# Patient Record
Sex: Female | Born: 1975 | Race: Black or African American | Hispanic: No | Marital: Single | Smoking: Current every day smoker
Health system: Southern US, Community
[De-identification: ages and names within clinical notes are randomized; demographics above are authoritative.]

## PROBLEM LIST (undated history)

## (undated) ENCOUNTER — Ambulatory Visit (HOSPITAL_COMMUNITY): Payer: Medicaid Other

## (undated) DIAGNOSIS — D649 Anemia, unspecified: Secondary | ICD-10-CM

## (undated) DIAGNOSIS — J45909 Unspecified asthma, uncomplicated: Secondary | ICD-10-CM

## (undated) DIAGNOSIS — J40 Bronchitis, not specified as acute or chronic: Secondary | ICD-10-CM

## (undated) HISTORY — PX: KNEE SURGERY: SHX244

## (undated) HISTORY — PX: CARPAL TUNNEL RELEASE: SHX101

## (undated) HISTORY — PX: HERNIA REPAIR: SHX51

---

## 2005-02-12 ENCOUNTER — Emergency Department: Payer: Self-pay | Admitting: Emergency Medicine

## 2005-02-27 ENCOUNTER — Emergency Department: Payer: Self-pay | Admitting: Unknown Physician Specialty
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
## 2005-10-29 ENCOUNTER — Observation Stay: Payer: Self-pay

## 2005-11-26 ENCOUNTER — Observation Stay: Payer: Self-pay | Admitting: Obstetrics & Gynecology

## 2005-12-11 ENCOUNTER — Inpatient Hospital Stay: Payer: Self-pay

## 2006-03-07 ENCOUNTER — Ambulatory Visit: Payer: Self-pay | Admitting: Surgery

## 2006-10-01 ENCOUNTER — Ambulatory Visit: Payer: Self-pay | Admitting: General Practice

## 2006-10-01 IMAGING — CR DG KNEE 1-2V*L*
1 series · 2 of 2 positions shown · non-contrast
Comparison: none

REASON FOR EXAM: Legs give out, depression, GIORGI problems
COMMENTS:

PROCEDURE:     DXR - DXR KNEE LEFT AP AND LATERAL  - [DATE]  [DATE]
RESULT:     Views of the LEFT knee demonstrate no definite fracture,
dislocation or radiopaque foreign body.

[Series 1: view not recorded · 0.17mm/px · 2 of 2 slices shown]
[im 1/2]
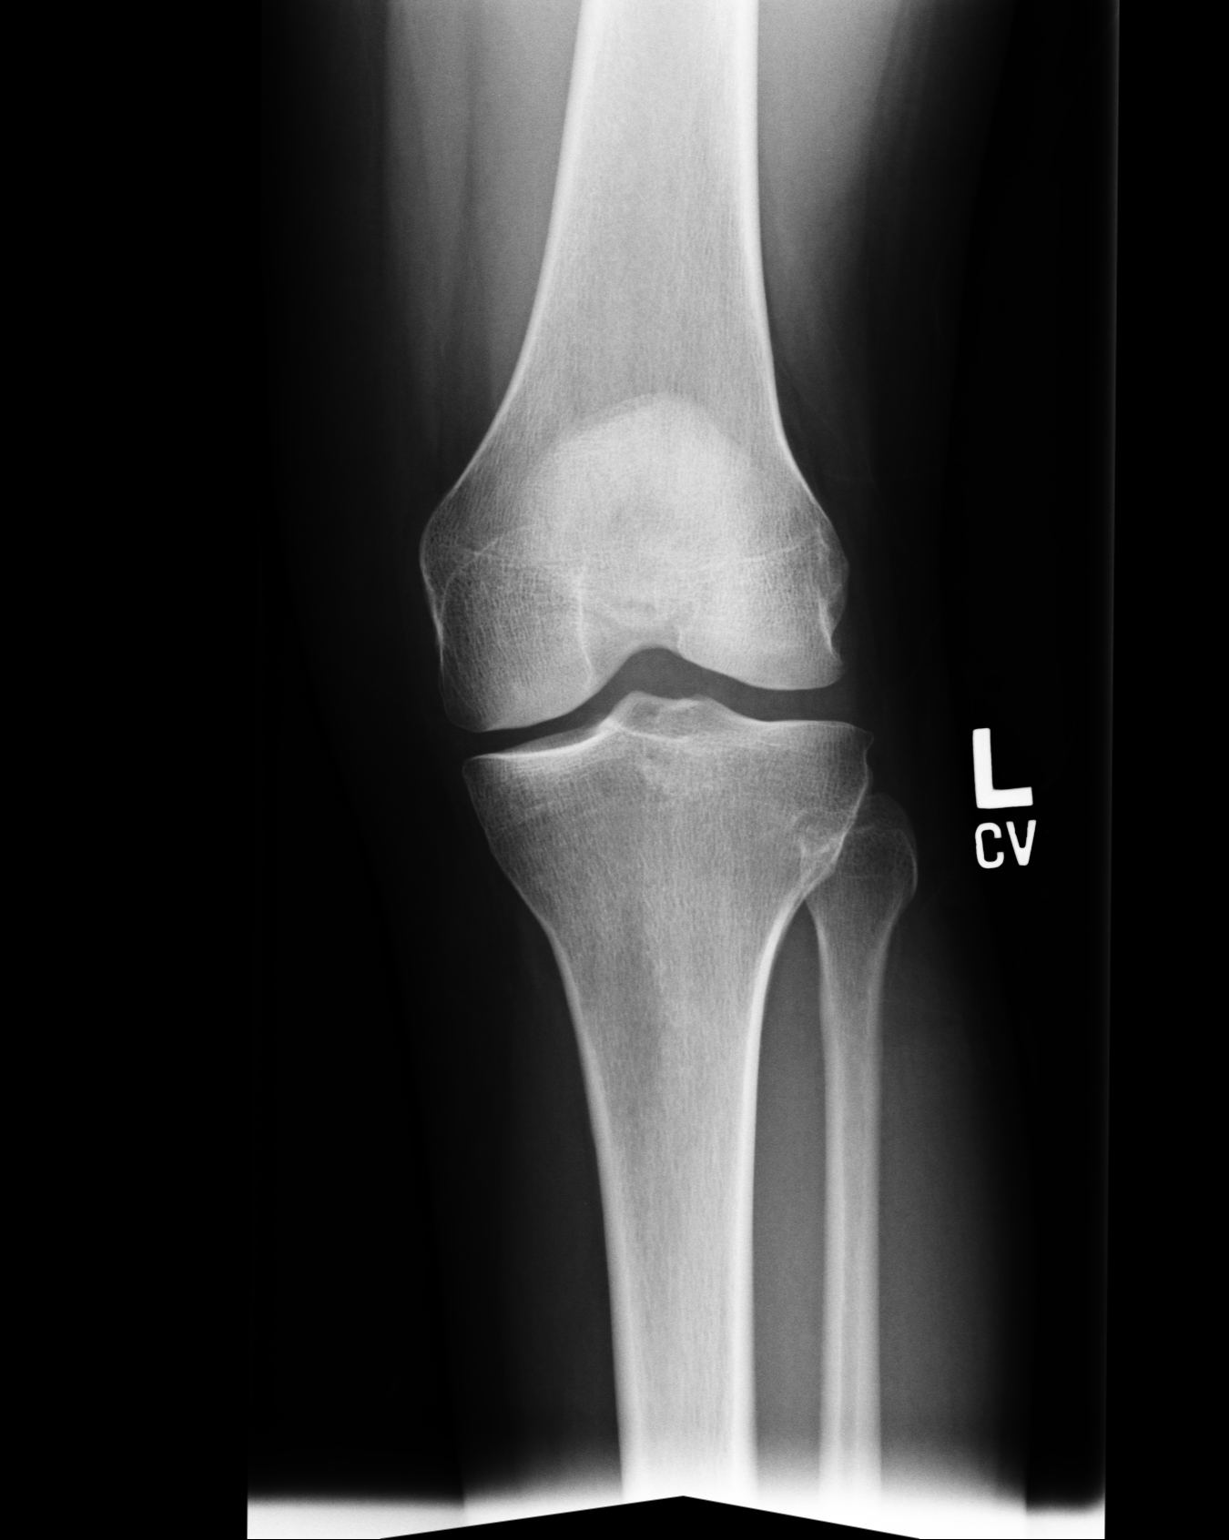
[im 2/2]
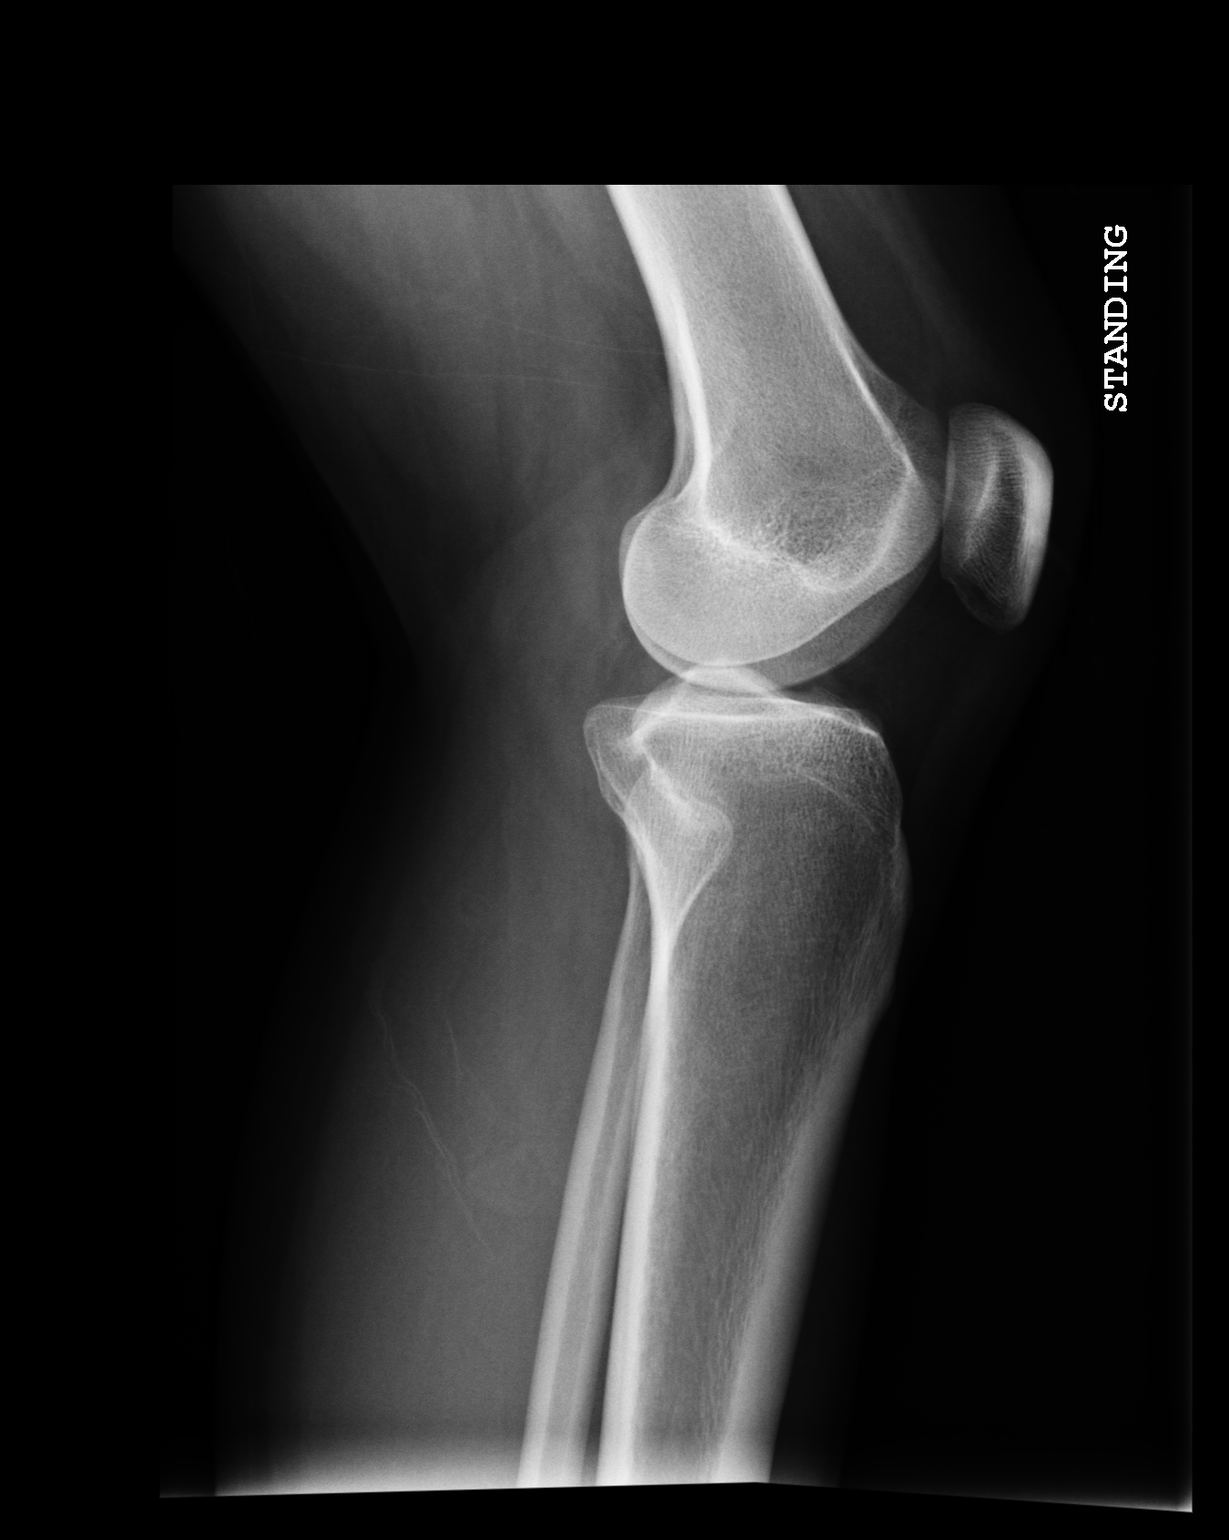

[2 of 2 positions shown; findings below may reference images not displayed]

IMPRESSION: No acute bony abnormality.

## 2008-09-12 ENCOUNTER — Emergency Department: Payer: Self-pay | Admitting: Emergency Medicine

## 2009-12-03 ENCOUNTER — Emergency Department: Payer: Self-pay | Admitting: Emergency Medicine

## 2009-12-03 IMAGING — US TRANSABDOMINAL ULTRASOUND OF PELVIS
1 series · 17 of 25 positions shown · non-contrast
Comparison: none

REASON FOR EXAM: lt sided pelvic pain
COMMENTS:

PROCEDURE:     US  - US PELVIS EXAM  - [DATE]  [DATE]
RESULT:     Pelvic ultrasound dated [DATE].
TECHNIQUE: Transabdominal imaging of the pelvis was obtained.

[Series 1: transabdominal ultrasound of pelvis · 17 of 41 slices shown]
[im 1/41]
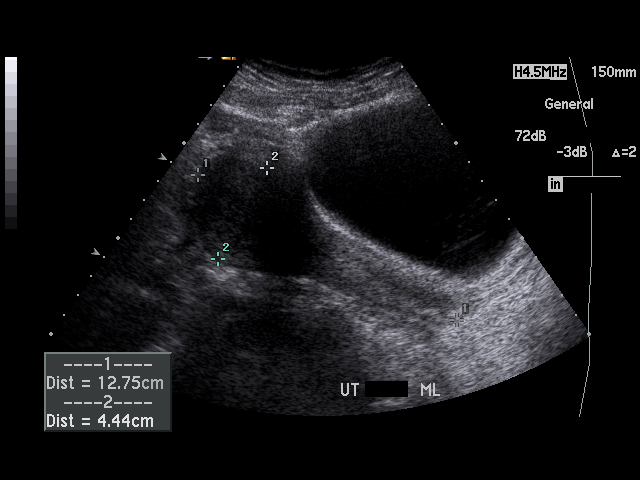
[im 4/41]
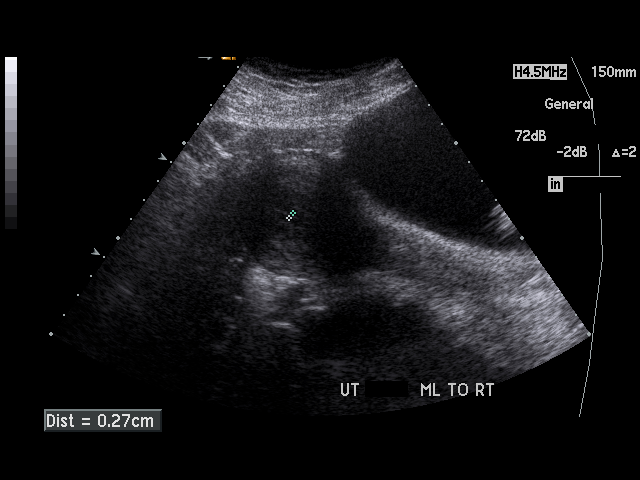
[im 6/41]
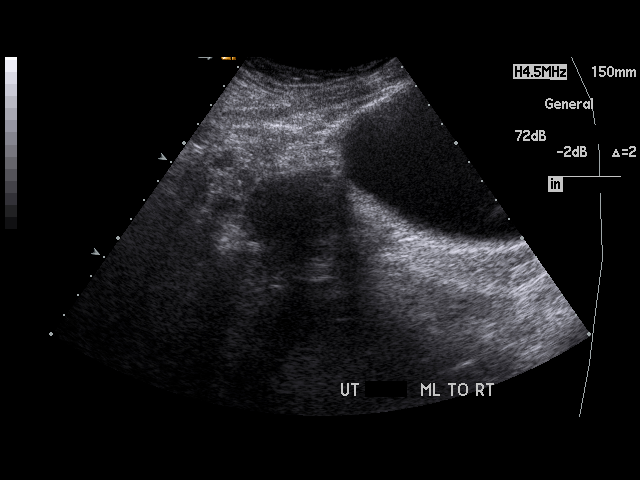
[im 9/41]
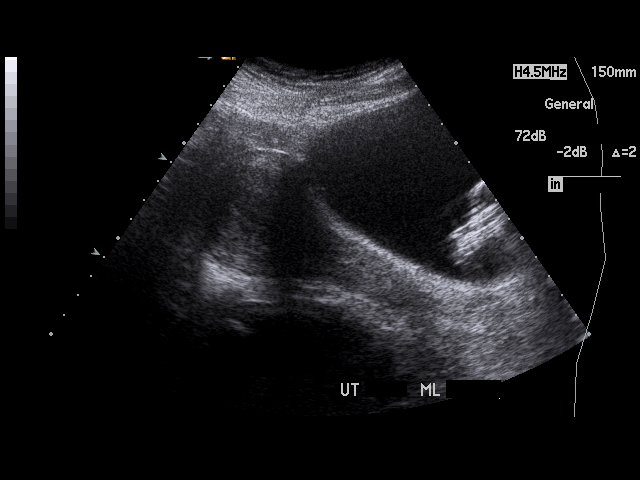
[im 11/41]
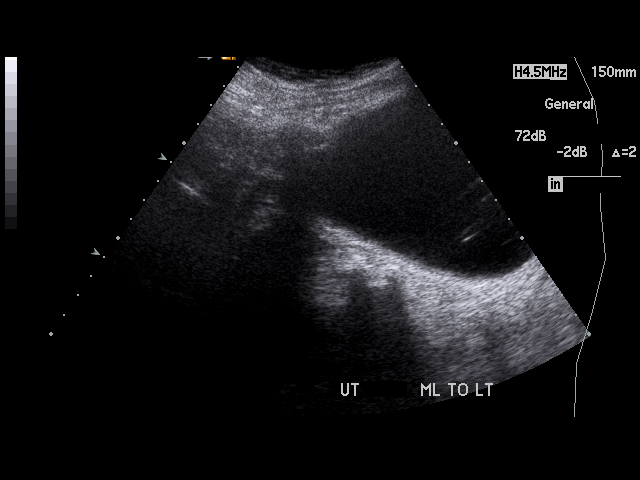
[im 14/41]
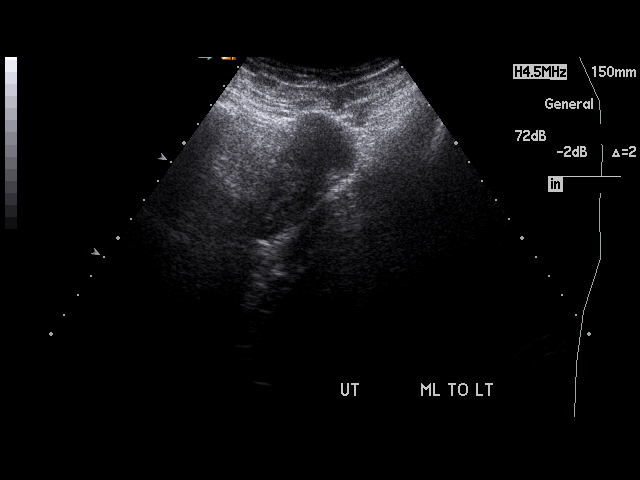
[im 16/41]
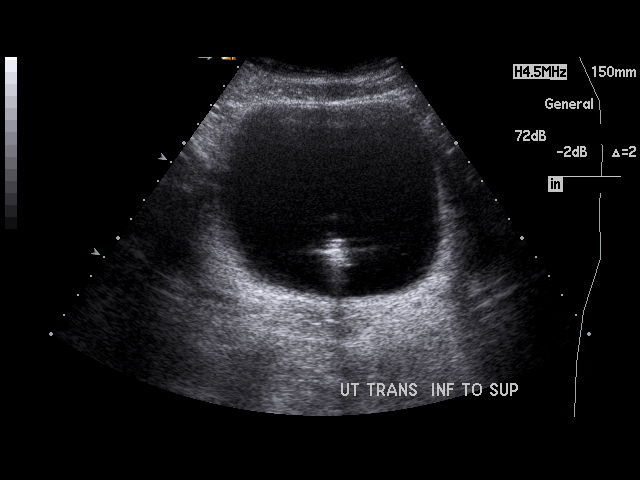
[im 19/41]
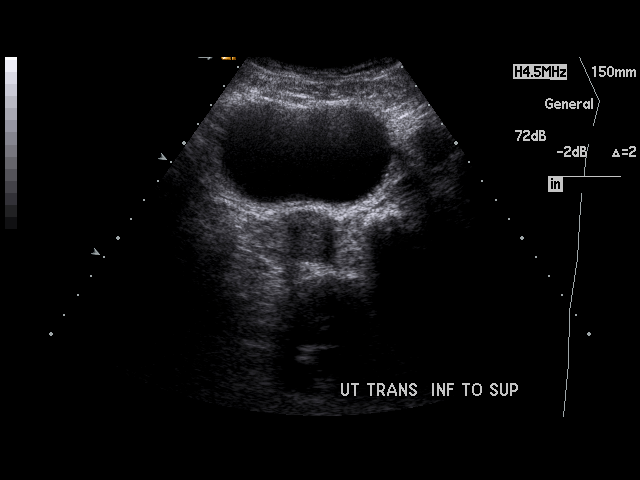
[im 21/41]
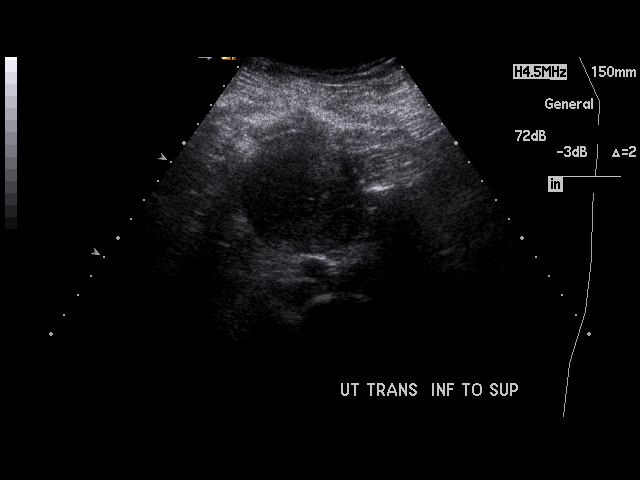
[im 22/41]
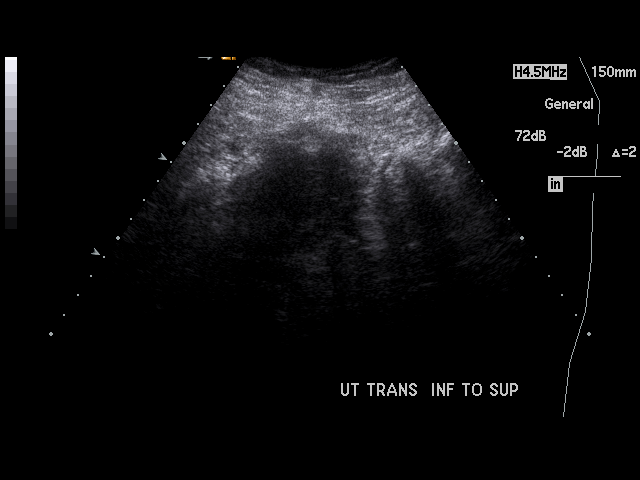
[im 26/41]
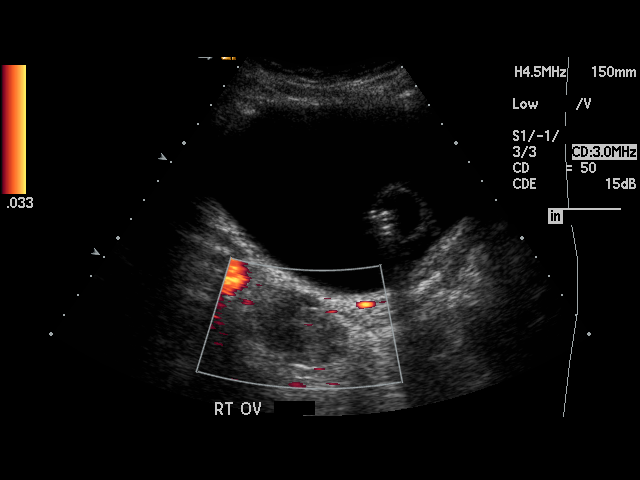
[im 27/41]
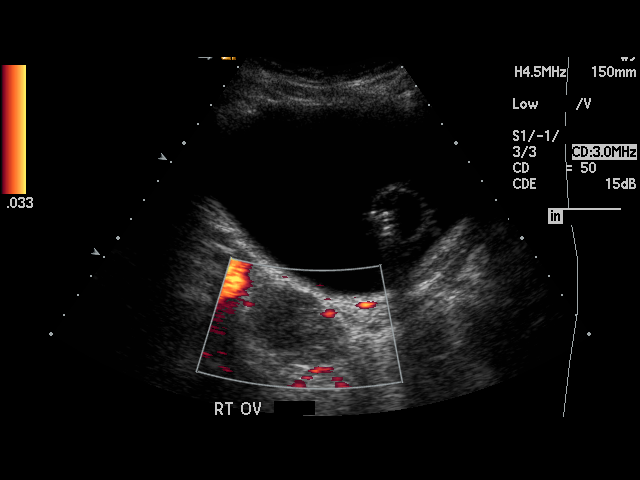
[im 31/41]
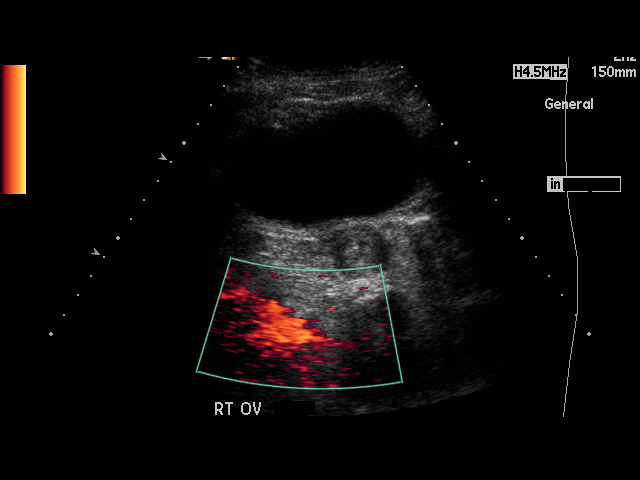
[im 32/41]
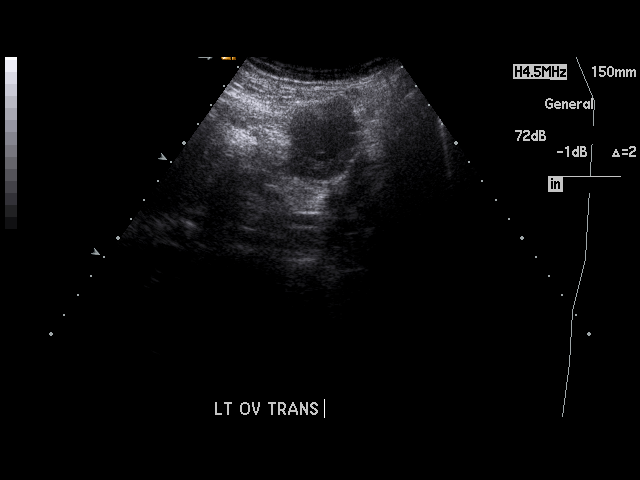
[im 36/41]
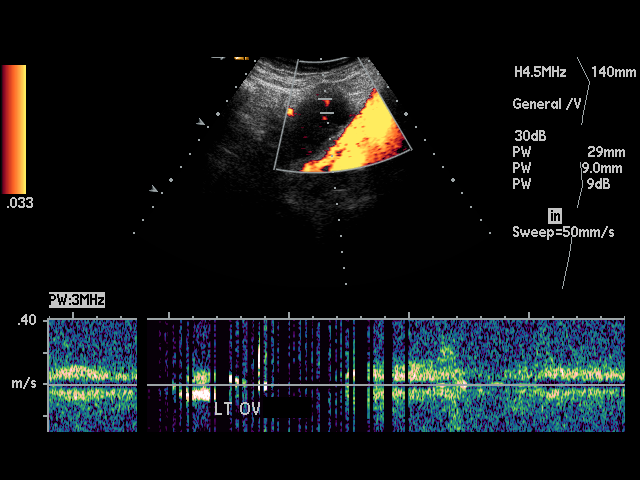
[im 37/41]
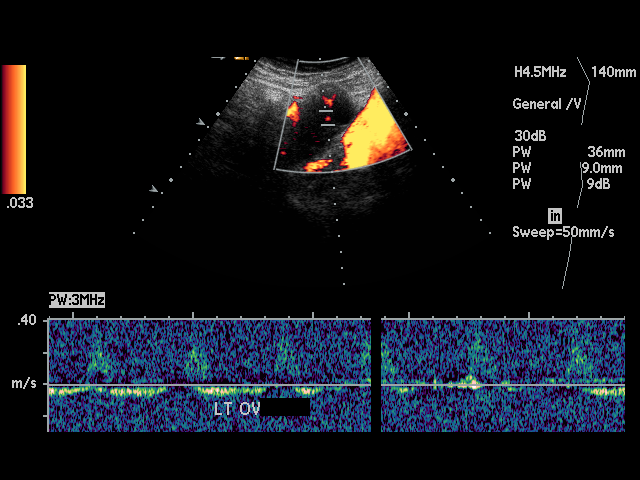
[im 41/41]
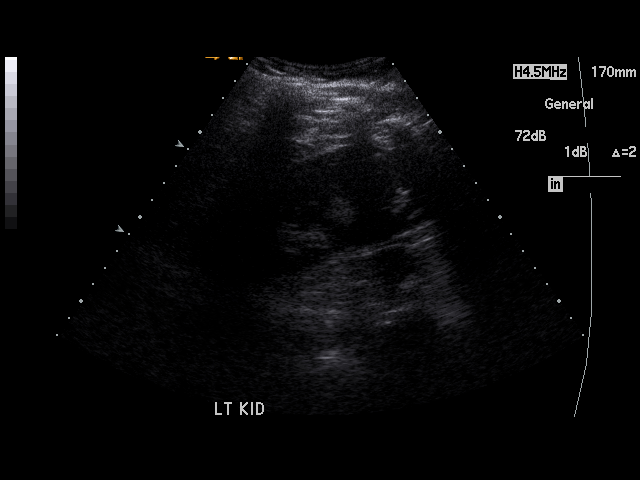

[17 of 25 positions shown; findings below may reference images not displayed]

FINDINGS: The uterus measures 12.75 x 4.4 for a 5.42 cm. The endometrial
thickness is 2.7 millimeters. The uterus is enlarged though demonstrates a
homogeneous echotexture. The right ovary measures 4.3 x 2.98 x 2.98 cm and
the left 5.2 x 2.7 x 3 cm. The ovaries demonstrate a homogeneous echotexture
arterial and venous flow is appreciated within the ovaries. There is no
evidence of pelvic free fluid, loculated fluid collections. There is no
evidence of hydronephrosis.
IMPRESSION: Enlarged uterus otherwise unremarkable pelvic ultrasound.

## 2011-04-16 ENCOUNTER — Ambulatory Visit: Payer: Self-pay | Admitting: Specialist

## 2011-04-27 ENCOUNTER — Ambulatory Visit: Payer: Self-pay | Admitting: Specialist

## 2011-05-02 DIAGNOSIS — M545 Low back pain, unspecified: Secondary | ICD-10-CM | POA: Insufficient documentation

## 2011-05-02 DIAGNOSIS — M5126 Other intervertebral disc displacement, lumbar region: Secondary | ICD-10-CM | POA: Insufficient documentation

## 2011-05-29 ENCOUNTER — Encounter: Payer: Self-pay | Admitting: Orthopedic Surgery

## 2011-05-31 ENCOUNTER — Encounter: Payer: Self-pay | Admitting: Orthopedic Surgery

## 2011-06-30 ENCOUNTER — Encounter: Payer: Self-pay | Admitting: Orthopedic Surgery

## 2011-07-31 ENCOUNTER — Encounter: Payer: Self-pay | Admitting: Orthopedic Surgery

## 2012-03-25 ENCOUNTER — Emergency Department: Payer: Self-pay | Admitting: Emergency Medicine

## 2012-04-24 ENCOUNTER — Encounter: Payer: Self-pay | Admitting: Primary Care

## 2012-04-29 ENCOUNTER — Encounter: Payer: Self-pay | Admitting: Primary Care

## 2012-05-30 ENCOUNTER — Encounter: Payer: Self-pay | Admitting: Primary Care

## 2013-02-13 DIAGNOSIS — N939 Abnormal uterine and vaginal bleeding, unspecified: Secondary | ICD-10-CM | POA: Insufficient documentation

## 2015-05-12 ENCOUNTER — Emergency Department
Admission: EM | Admit: 2015-05-12 | Discharge: 2015-05-12 | Disposition: A | Discharge status: Home / Self Care | Payer: Medicaid Other | Attending: Emergency Medicine | Admitting: Emergency Medicine

## 2015-05-12 ENCOUNTER — Emergency Department: Payer: Medicaid Other

## 2015-05-12 ENCOUNTER — Encounter: Payer: Self-pay | Admitting: Emergency Medicine

## 2015-05-12 DIAGNOSIS — M79642 Pain in left hand: Secondary | ICD-10-CM | POA: Diagnosis not present

## 2015-05-12 DIAGNOSIS — Z720 Tobacco use: Secondary | ICD-10-CM | POA: Insufficient documentation

## 2015-05-12 HISTORY — DX: Unspecified asthma, uncomplicated: J45.909

## 2015-05-12 IMAGING — CR DG HAND COMPLETE 3+V*L*
1 series · 3 of 3 positions shown · non-contrast
Comparison: None.

CLINICAL DATA: 39-year-old female with acute left hand pain for 1
week.

EXAM:
LEFT HAND - COMPLETE 3+ VIEW

[Series 1: x hand pa left · 0.14mm/px · 3 of 3 slices shown]
[im 1/3]
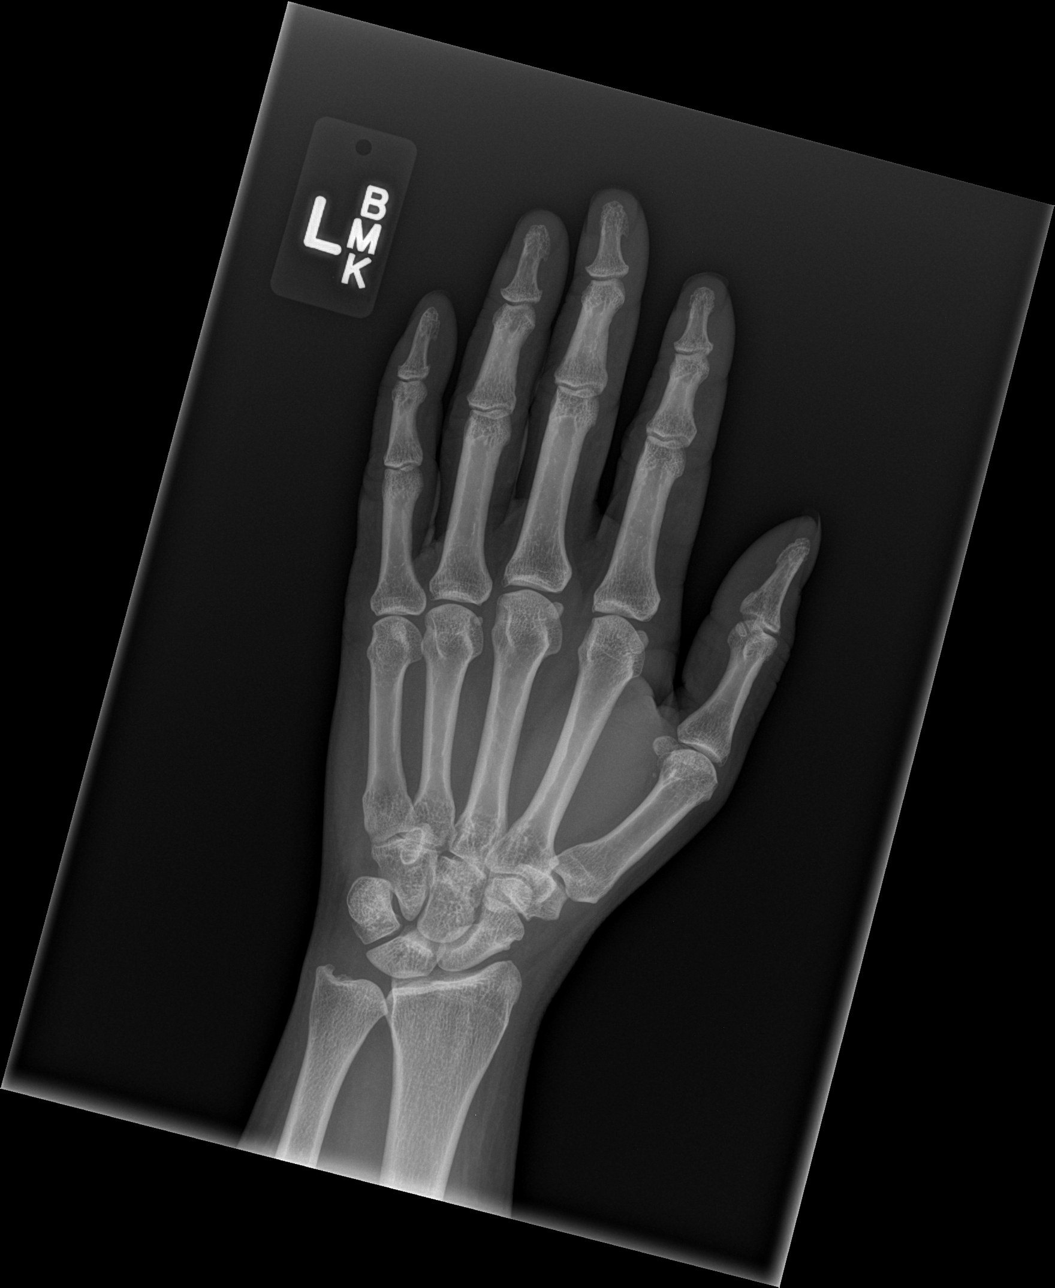
[im 2/3]
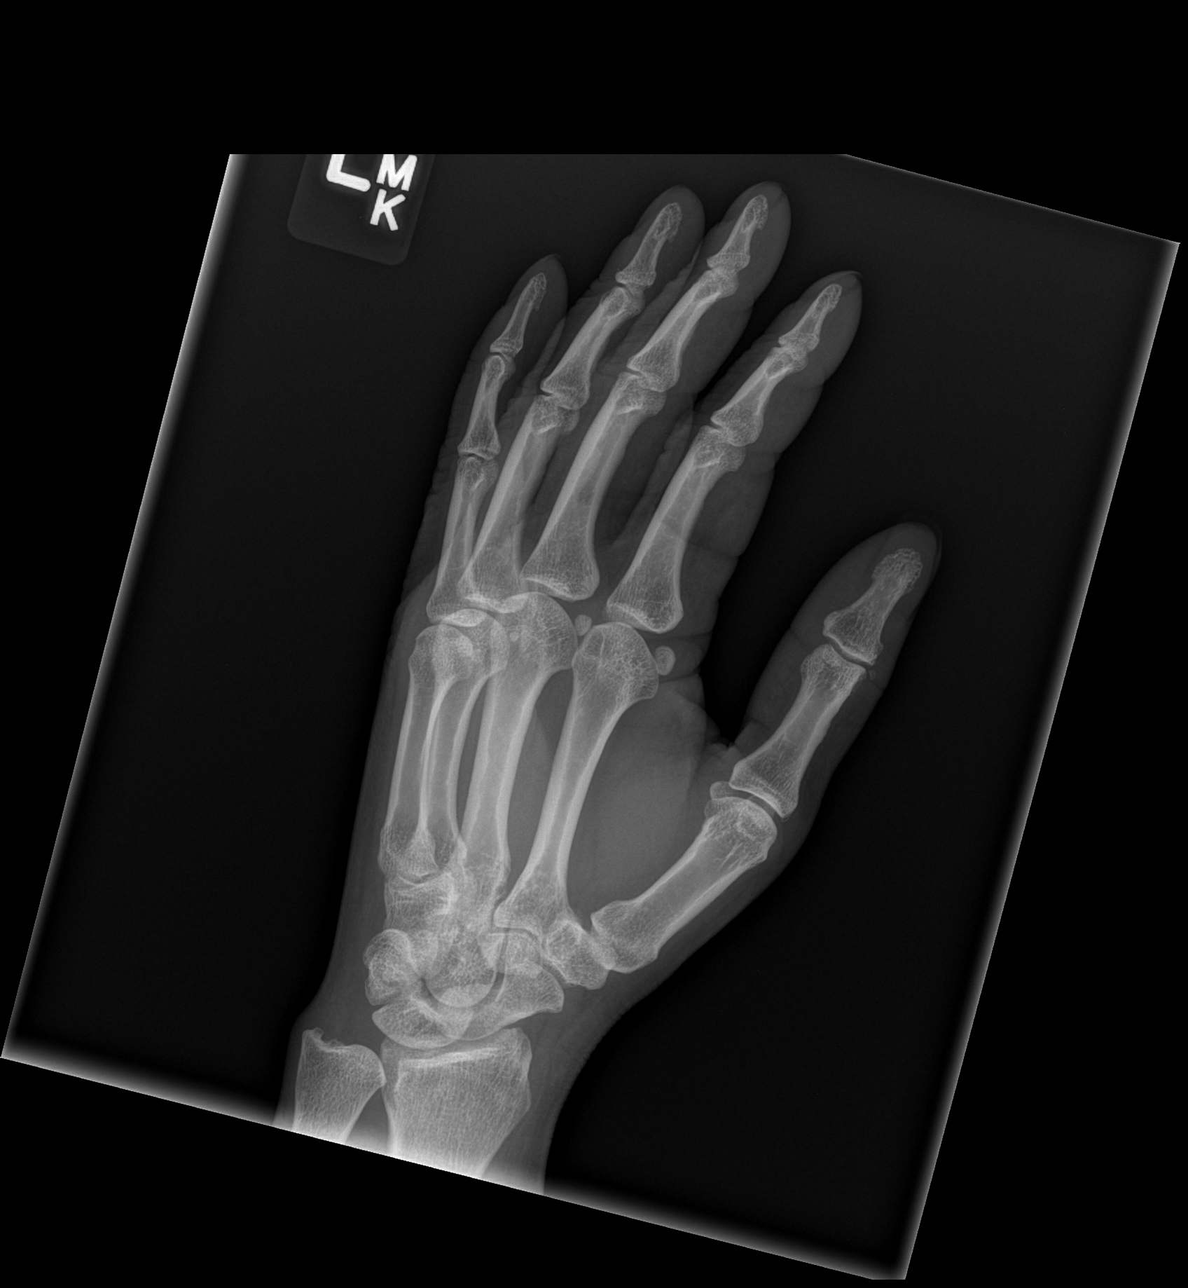
[im 3/3]
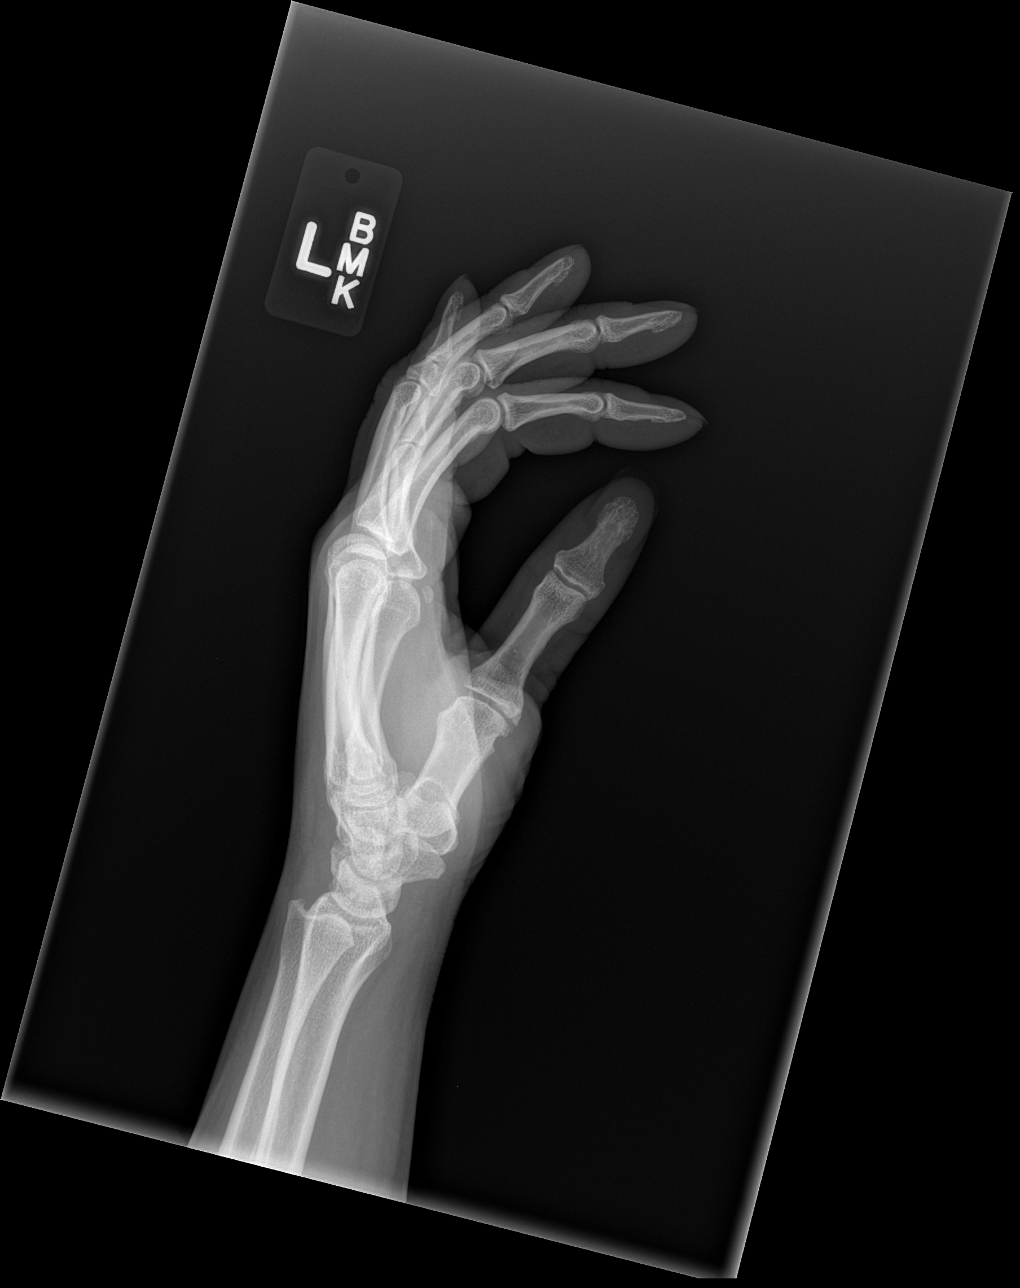

[3 of 3 positions shown; findings below may reference images not displayed]

FINDINGS: There is no evidence of fracture or dislocation. There is no
evidence of arthropathy or other focal bone abnormality. Soft
tissues are unremarkable.
IMPRESSION: Negative.

## 2015-05-12 MED ORDER — NABUMETONE 750 MG PO TABS
750.0000 mg | ORAL_TABLET | Freq: Two times a day (BID) | ORAL | Status: DC
Start: 2015-05-12 — End: 2015-10-05

## 2015-05-12 MED ORDER — CYCLOBENZAPRINE HCL 5 MG PO TABS: 5.0000 mg | ORAL_TABLET | Freq: Three times a day (TID) | ORAL | Status: DC | PRN

## 2015-05-12 NOTE — ED Provider Notes (Signed)
[facility] Emergency Department Provider Note ____________________________________________  Time seen: 76  I have reviewed the triage vital signs and the nursing notes.  HISTORY  Chief Complaint  Hand Pain  HPI Nichole Barber is a 39 y.o. female reports to the ED for evaluation management of pain to the left wrist since yesterday. She denies any significant injury, trauma, crush, fall. She is been using Epsom salts soaks, alcohol or rubs at her wrist cock-up splint without significant relief. She also been dosing ibuprofen intermittently for pain relief. She gives a remote history of a bilateral carpal tunnel release done in about 2013.Since that time she is noted intermittent hand pain. She claims to be ambidextrous in her activities. She rates her pain at a 10/10 in triage.  Past Medical History  Diagnosis Date  . Asthma     There are no active problems to display for this patient.   Past Surgical History  Procedure Laterality Date  . Carpal tunnel release      Current Outpatient Rx  Name  Route  Sig  Dispense  Refill  . cyclobenzaprine (FLEXERIL) 5 MG tablet   Oral   Take 1 tablet (5 mg total) by mouth every 8 (eight) hours as needed for muscle spasms.   12 tablet   0   . nabumetone (RELAFEN) 750 MG tablet   Oral   Take 1 tablet (750 mg total) by mouth 2 (two) times daily.   30 tablet   0     Allergies Review of patient's allergies indicates no known allergies.  No family history on file.  Social History Social History  Substance Use Topics  . Smoking status: Current Every Day Smoker  . Smokeless tobacco: None  . Alcohol Use: No   Review of Systems  Constitutional: Negative for fever. Eyes: Negative for visual changes. ENT: Negative for sore throat. Cardiovascular: Negative for chest pain. Respiratory: Negative for shortness of breath. Gastrointestinal: Negative for abdominal pain, vomiting and diarrhea. Genitourinary:  Negative for dysuria. Musculoskeletal: Negative for back pain. Left hand pain as above Skin: Negative for rash. Neurological: Negative for headaches, focal weakness or numbness. ____________________________________________  PHYSICAL EXAM:  VITAL SIGNS: ED Triage Vitals  Enc Vitals Group     BP 05/12/15 1844 131/83 mmHg     Pulse Rate 05/12/15 1844 105     Resp 05/12/15 1844 18     Temp 05/12/15 1844 98.2 F (36.8 C)     Temp Source 05/12/15 1844 Oral     SpO2 05/12/15 1844 100 %     Weight 05/12/15 1844 150 lb (68.04 kg)     Height 05/12/15 1844 5\' 5"  (1.651 m)     Head Cir --      Peak Flow --      Pain Score 05/12/15 1845 10     Pain Loc --      Pain Edu? --      Excl. in [place]? --    Constitutional: Alert and oriented. Well appearing and in no distress. Head: Normocephalic and atraumatic.      Eyes: Conjunctivae are normal. PERRL. Normal extraocular movements      Ears: Canals clear. TMs intact bilaterally.   Nose: No congestion/rhinorrhea.   Mouth/Throat: Mucous membranes are moist.   Neck: Supple. No thyromegaly. Hematological/Lymphatic/Immunological: No cervical lymphadenopathy. Cardiovascular: Normal rate, regular rhythm.  Respiratory: Normal respiratory effort. No wheezes/rales/rhonchi. Gastrointestinal: Soft and nontender. No distention. Musculoskeletal: Left hand without obvious deformity, dislocation, abrasion, effusion, soft tissue  swelling. Normal composite fist on exam. Self-limited range of motion on exam. Nontender with normal range of motion in all extremities.  Neurologic:  Cranial nerves II through XII grossly intact. Normal upper prescription DTRs bilaterally. Normal intrinsic and opposition testing. Negative Finkelstein. No cubital or carpal Tinel's. Normal gait without ataxia. Normal speech and language. No gross focal neurologic deficits are appreciated. Skin:  Skin is warm, dry and intact. No rash noted. Psychiatric: Mood and affect are normal.  Patient exhibits appropriate insight and judgment. ____________________________________________   RADIOLOGY Left Hand IMPRESSION: Negative.  I, Darl Kuss, Dannielle Karvonen, personally viewed and evaluated these images (plain radiographs) as part of my medical decision making.  ____________________________________________  PROCEDURES  Ace wrap Home cock-up splint ____________________________________________  INITIAL IMPRESSION / ASSESSMENT AND PLAN / ED COURSE  Radiology results to the patient. She discharged home with a left hand pain/sprain. 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 She is provided with prescription for Flexeril and Relafen. She is to follow with Dr. Earnestine Leys for ongoing symptoms. ____________________________________________  FINAL CLINICAL IMPRESSION(S) / ED DIAGNOSES  Final diagnoses:  Hand pain, left      Melvenia Needles, PA-C 05/12/15 2015  Hinda Kehr, MD 05/12/15 2313

## 2015-05-12 NOTE — Discharge Instructions (Signed)
Musculoskeletal Pain Musculoskeletal pain is muscle and boney aches and pains. These pains can occur in any part of the body. Your caregiver may treat you without knowing the cause of the pain. They may treat you if blood or urine tests, X-rays, and other tests were normal.  CAUSES There is often not a definite cause or reason for these pains. These pains may be caused by a type of germ (virus). The discomfort may also come from overuse. Overuse includes working out too hard when your body is not fit. Boney aches also come from weather changes. Bone is sensitive to atmospheric pressure changes. HOME CARE INSTRUCTIONS   Ask when your test results will be ready. Make sure you get your test results.  Only take over-the-counter or prescription medicines for pain, discomfort, or fever as directed by your caregiver. If you were given medications for your condition, do not drive, operate machinery or power tools, or sign legal documents for 24 hours. Do not drink alcohol. Do not take sleeping pills or other medications that may interfere with treatment.  Continue all activities unless the activities cause more pain. When the pain lessens, slowly resume normal activities. Gradually increase the intensity and duration of the activities or exercise.  During periods of severe pain, bed rest may be helpful. Lay or sit in any position that is comfortable.  Putting ice on the injured area.  Put ice in a bag.  Place a towel between your skin and the bag.  Leave the ice on for 15 to 20 minutes, 3 to 4 times a day.  Follow up with your caregiver for continued problems and no reason can be found for the pain. If the pain becomes worse or does not go away, it may be necessary to repeat tests or do additional testing. Your caregiver may need to look further for a possible cause. SEEK IMMEDIATE MEDICAL CARE IF:  You have pain that is getting worse and is not relieved by medications.  You develop chest pain  that is associated with shortness or breath, sweating, feeling sick to your stomach (nauseous), or throw up (vomit).  Your pain becomes localized to the abdomen.  You develop any new symptoms that seem different or that concern you. MAKE SURE YOU:   Understand these instructions.  Will watch your condition.  Will get help right away if you are not doing well or get worse.   This information is not intended to replace advice given to you by your health care provider. Make sure you discuss any questions you have with your health care provider.   Document Released: 07/16/2005 Document Revised: 10/08/2011 Document Reviewed: 03/20/2013 Elsevier Interactive Patient Education [employer].   Your exam and x-ray are normal today.  Continue to wear your wrist splint.  Apply ice to reduce symptoms. Follow-up with Dr. Sabra Heck for continued symptoms.

## 2015-05-12 NOTE — ED Notes (Signed)
PA jenise at bedside.

## 2015-05-12 NOTE — ED Notes (Addendum)
C/o left wrist pain since yesterday, states she has been having pain for a while but worse since yesterday, hx of carpel tunnel

## 2015-06-06 ENCOUNTER — Emergency Department
Admission: EM | Admit: 2015-06-06 | Discharge: 2015-06-06 | Disposition: A | Discharge status: Home / Self Care | Payer: Medicaid Other | Attending: Emergency Medicine | Admitting: Emergency Medicine

## 2015-06-06 ENCOUNTER — Encounter: Payer: Self-pay | Admitting: Emergency Medicine

## 2015-06-06 DIAGNOSIS — S39012A Strain of muscle, fascia and tendon of lower back, initial encounter: Secondary | ICD-10-CM | POA: Diagnosis not present

## 2015-06-06 DIAGNOSIS — Z79899 Other long term (current) drug therapy: Secondary | ICD-10-CM | POA: Diagnosis not present

## 2015-06-06 DIAGNOSIS — Y998 Other external cause status: Secondary | ICD-10-CM | POA: Diagnosis not present

## 2015-06-06 DIAGNOSIS — Z720 Tobacco use: Secondary | ICD-10-CM | POA: Diagnosis not present

## 2015-06-06 DIAGNOSIS — Y9389 Activity, other specified: Secondary | ICD-10-CM | POA: Diagnosis not present

## 2015-06-06 DIAGNOSIS — Y92481 Parking lot as the place of occurrence of the external cause: Secondary | ICD-10-CM | POA: Diagnosis not present

## 2015-06-06 DIAGNOSIS — S3992XA Unspecified injury of lower back, initial encounter: Secondary | ICD-10-CM | POA: Diagnosis present

## 2015-06-06 MED ORDER — CYCLOBENZAPRINE HCL 5 MG PO TABS: 5.0000 mg | ORAL_TABLET | Freq: Three times a day (TID) | ORAL | Status: DC | PRN

## 2015-06-06 MED ORDER — IBUPROFEN 800 MG PO TABS
800.0000 mg | ORAL_TABLET | Freq: Three times a day (TID) | ORAL | Status: DC | PRN
Start: 2015-06-06 — End: 2015-10-05
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
## 2015-06-06 NOTE — Discharge Instructions (Signed)
Cryotherapy Cryotherapy is when you put ice on your injury. Ice helps lessen pain and puffiness (swelling) after an injury. Ice works the best when you start using it in the first 24 to 48 hours after an injury. HOME CARE  Put a dry or damp towel between the ice pack and your skin.  You may press gently on the ice pack.  Leave the ice on for no more than 10 to 20 minutes at a time.  Check your skin after 5 minutes to make sure your skin is okay.  Rest at least 20 minutes between ice pack uses.  Stop using ice when your skin loses feeling (numbness).  Do not use ice on someone who cannot tell you when it hurts. This includes small children and people with memory problems (dementia). GET HELP RIGHT AWAY IF:  You have white spots on your skin.  Your skin turns blue or pale.  Your skin feels waxy or hard.  Your puffiness gets worse. MAKE SURE YOU:   Understand these instructions.  Will watch your condition.  Will get help right away if you are not doing well or get worse.   This information is not intended to replace advice given to you by your health care provider. Make sure you discuss any questions you have with your health care provider.   Document Released: 01/02/2008 Document Revised: 10/08/2011 Document Reviewed: 03/08/2011 Elsevier Interactive Patient Education 2016 [place] Strain With Rehab A strain is an injury in which a tendon or muscle is torn. The muscles and tendons of the lower back are vulnerable to strains. However, these muscles and tendons are very strong and require a great force to be injured. Strains are classified into three categories. Grade 1 strains cause pain, but the tendon is not lengthened. Grade 2 strains include a lengthened ligament, due to the ligament being stretched or partially ruptured. With grade 2 strains there is still function, although the function may be decreased. Grade 3 strains involve a complete tear of the tendon  or muscle, and function is usually impaired. SYMPTOMS   Pain in the lower back.  Pain that affects one side more than the other.  Pain that gets worse with movement and may be felt in the hip, buttocks, or back of the thigh.  Muscle spasms of the muscles in the back.  Swelling along the muscles of the back.  Loss of strength of the back muscles.  Crackling sound (crepitation) when the muscles are touched. CAUSES  Lower back strains occur when a force is placed on the muscles or tendons that is greater than they can handle. Common causes of injury include:  Prolonged overuse of the muscle-tendon units in the lower back, usually from incorrect posture.  A single violent injury or force applied to the back. RISK INCREASES WITH:  Sports that involve twisting forces on the spine or a lot of bending at the waist (football, rugby, weightlifting, bowling, golf, tennis, speed skating, racquetball, swimming, running, gymnastics, diving).  Poor strength and flexibility.  Failure to warm up properly before activity.  Family history of lower back pain or disk disorders.  Previous back injury or surgery (especially fusion).  Poor posture with lifting, especially heavy objects.  Prolonged sitting, especially with poor posture. PREVENTION   Learn and use proper posture when sitting or lifting (maintain proper posture when sitting, lift using the knees and legs, not at the waist).  Warm up and stretch properly before activity.  Allow for adequate recovery between workouts.  Maintain physical fitness:  Strength, flexibility, and endurance.  Cardiovascular fitness. PROGNOSIS  If treated properly, lower back strains usually heal within 6 weeks. RELATED COMPLICATIONS   Recurring symptoms, resulting in a chronic problem.  Chronic inflammation, scarring, and partial muscle-tendon tear.  Delayed healing or resolution of symptoms.  Prolonged disability. TREATMENT  Treatment first  involves the use of ice and medicine, to reduce pain and inflammation. The use of strengthening and stretching exercises may help reduce pain with activity. These exercises may be performed at home or with a therapist. Severe injuries may require referral to a therapist for further evaluation and treatment, such as ultrasound. Your caregiver may advise that you wear a back brace or corset, to help reduce pain and discomfort. Often, prolonged bed rest results in greater harm then benefit. Corticosteroid injections may be recommended. However, these should be reserved for the most serious cases. It is important to avoid using your back when lifting objects. At night, sleep on your back on a firm mattress with a pillow placed under your knees. If non-surgical treatment is unsuccessful, surgery may be needed.  MEDICATION   If pain medicine is needed, nonsteroidal anti-inflammatory medicines (aspirin and ibuprofen), or other minor pain relievers (acetaminophen), are often advised.  Do not take pain medicine for 7 days before surgery.  Prescription pain relievers may be given, if your caregiver thinks they are needed. Use only as directed and only as much as you need.  Ointments applied to the skin may be helpful.  Corticosteroid injections may be given by your caregiver. These injections should be reserved for the most serious cases, because they may only be given a certain number of times. HEAT AND COLD  Cold treatment (icing) should be applied for 10 to 15 minutes every 2 to 3 hours for inflammation and pain, and immediately after activity that aggravates your symptoms. Use ice packs or an ice massage.  Heat treatment may be used before performing stretching and strengthening activities prescribed by your caregiver, physical therapist, or athletic trainer. Use a heat pack or a warm water soak. SEEK MEDICAL CARE IF:   Symptoms get worse or do not improve in 2 to 4 weeks, despite treatment.  You  develop numbness, weakness, or loss of bowel or bladder function.  New, unexplained symptoms develop. (Drugs used in treatment may produce side effects.) EXERCISES  RANGE OF MOTION (ROM) AND STRETCHING EXERCISES - Low Back Strain Most people with lower back pain will find that their symptoms get worse with excessive bending forward (flexion) or arching at the lower back (extension). The exercises which will help resolve your symptoms will focus on the opposite motion.  Your physician, physical therapist or athletic trainer will help you determine which exercises will be most helpful to resolve your lower back pain. Do not complete any exercises without first consulting with your caregiver. Discontinue any exercises which make your symptoms worse until you speak to your caregiver.  If you have pain, numbness or tingling which travels down into your buttocks, leg or foot, the goal of the therapy is for these symptoms to move closer to your back and eventually resolve. Sometimes, these leg symptoms will get better, but your lower back pain may worsen. This is typically an indication of progress in your rehabilitation. Be very alert to any changes in your symptoms and the activities in which you participated in the 24 hours prior to the change. Sharing this information with your  caregiver will allow him/her to most efficiently treat your condition.  These exercises may help you when beginning to rehabilitate your injury. Your symptoms may resolve with or without further involvement from your physician, physical therapist or athletic trainer. While completing these exercises, remember:  Restoring tissue flexibility helps normal motion to return to the joints. This allows healthier, less painful movement and activity.  An effective stretch should be held for at least 30 seconds.  A stretch should never be painful. You should only feel a gentle lengthening or release in the stretched tissue. FLEXION RANGE  OF MOTION AND STRETCHING EXERCISES: STRETCH - Flexion, Single Knee to Chest   Lie on a firm bed or floor with both legs extended in front of you.  Keeping one leg in contact with the floor, bring your opposite knee to your chest. Hold your leg in place by either grabbing behind your thigh or at your knee.  Pull until you feel a gentle stretch in your lower back. Hold __________ seconds.  Slowly release your grasp and repeat the exercise with the opposite side. Repeat __________ times. Complete this exercise __________ times per day.  STRETCH - Flexion, Double Knee to Chest   Lie on a firm bed or floor with both legs extended in front of you.  Keeping one leg in contact with the floor, bring your opposite knee to your chest.  Tense your stomach muscles to support your back and then lift your other knee to your chest. Hold your legs in place by either grabbing behind your thighs or at your knees.  Pull both knees toward your chest until you feel a gentle stretch in your lower back. Hold __________ seconds.  Tense your stomach muscles and slowly return one leg at a time to the floor. Repeat __________ times. Complete this exercise __________ times per day.  STRETCH - Low Trunk Rotation  Lie on a firm bed or floor. Keeping your legs in front of you, bend your knees so they are both pointed toward the ceiling and your feet are flat on the floor.  Extend your arms out to the side. This will stabilize your upper body by keeping your shoulders in contact with the floor.  Gently and slowly drop both knees together to one side until you feel a gentle stretch in your lower back. Hold for __________ seconds.  Tense your stomach muscles to support your lower back as you bring your knees back to the starting position. Repeat the exercise to the other side. Repeat __________ times. Complete this exercise __________ times per day  EXTENSION RANGE OF MOTION AND FLEXIBILITY EXERCISES: STRETCH -  Extension, Prone on Elbows   Lie on your stomach on the floor, a bed will be too soft. Place your palms about shoulder width apart and at the height of your head.  Place your elbows under your shoulders. If this is too painful, stack pillows under your chest.  Allow your body to relax so that your hips drop lower and make contact more completely with the floor.  Hold this position for __________ seconds.  Slowly return to lying flat on the floor. Repeat __________ times. Complete this exercise __________ times per day.  RANGE OF MOTION - Extension, Prone Press Ups  Lie on your stomach on the floor, a bed will be too soft. Place your palms about shoulder width apart and at the height of your head.  Keeping your back as relaxed as possible, slowly straighten your elbows while keeping  your hips on the floor. You may adjust the placement of your hands to maximize your comfort. As you gain motion, your hands will come more underneath your shoulders.  Hold this position __________ seconds.  Slowly return to lying flat on the floor. Repeat __________ times. Complete this exercise __________ times per day.  RANGE OF MOTION- Quadruped, Neutral Spine   Assume a hands and knees position on a firm surface. Keep your hands under your shoulders and your knees under your hips. You may place padding under your knees for comfort.  Drop your head and point your tail bone toward the ground below you. This will round out your lower back like an angry cat. Hold this position for __________ seconds.  Slowly lift your head and release your tail bone so that your back sags into a large arch, like an old horse.  Hold this position for __________ seconds.  Repeat this until you feel limber in your lower back.  Now, find your "sweet spot." This will be the most comfortable position somewhere between the two previous positions. This is your neutral spine. Once you have found this position, tense your stomach  muscles to support your lower back.  Hold this position for __________ seconds. Repeat __________ times. Complete this exercise __________ times per day.  STRENGTHENING EXERCISES - Low Back Strain These exercises may help you when beginning to rehabilitate your injury. These exercises should be done near your "sweet spot." This is the neutral, low-back arch, somewhere between fully rounded and fully arched, that is your least painful position. When performed in this safe range of motion, these exercises can be used for people who have either a flexion or extension based injury. These exercises may resolve your symptoms with or without further involvement from your physician, physical therapist or athletic trainer. While completing these exercises, remember:   Muscles can gain both the endurance and the strength needed for everyday activities through controlled exercises.  Complete these exercises as instructed by your physician, physical therapist or athletic trainer. Increase the resistance and repetitions only as guided.  You may experience muscle soreness or fatigue, but the pain or discomfort you are trying to eliminate should never worsen during these exercises. If this pain does worsen, stop and make certain you are following the directions exactly. If the pain is still present after adjustments, discontinue the exercise until you can discuss the trouble with your caregiver. STRENGTHENING - Deep Abdominals, Pelvic Tilt  Lie on a firm bed or floor. Keeping your legs in front of you, bend your knees so they are both pointed toward the ceiling and your feet are flat on the floor.  Tense your lower abdominal muscles to press your lower back into the floor. This motion will rotate your pelvis so that your tail bone is scooping upwards rather than pointing at your feet or into the floor.  With a gentle tension and even breathing, hold this position for __________ seconds. Repeat __________ times.  Complete this exercise __________ times per day.  STRENGTHENING - Abdominals, Crunches   Lie on a firm bed or floor. Keeping your legs in front of you, bend your knees so they are both pointed toward the ceiling and your feet are flat on the floor. Cross your arms over your chest.  Slightly tip your chin down without bending your neck.  Tense your abdominals and slowly lift your trunk high enough to just clear your shoulder blades. Lifting higher can put excessive stress on the  lower back and does not further strengthen your abdominal muscles.  Control your return to the starting position. Repeat __________ times. Complete this exercise __________ times per day.  STRENGTHENING - Quadruped, Opposite UE/LE Lift   Assume a hands and knees position on a firm surface. Keep your hands under your shoulders and your knees under your hips. You may place padding under your knees for comfort.  Find your neutral spine and gently tense your abdominal muscles so that you can maintain this position. Your shoulders and hips should form a rectangle that is parallel with the floor and is not twisted.  Keeping your trunk steady, lift your right hand no higher than your shoulder and then your left leg no higher than your hip. Make sure you are not holding your breath. Hold this position __________ seconds.  Continuing to keep your abdominal muscles tense and your back steady, slowly return to your starting position. Repeat with the opposite arm and leg. Repeat __________ times. Complete this exercise __________ times per day.  STRENGTHENING - Lower Abdominals, Double Knee Lift  Lie on a firm bed or floor. Keeping your legs in front of you, bend your knees so they are both pointed toward the ceiling and your feet are flat on the floor.  Tense your abdominal muscles to brace your lower back and slowly lift both of your knees until they come over your hips. Be certain not to hold your breath.  Hold __________  seconds. Using your abdominal muscles, return to the starting position in a slow and controlled manner. Repeat __________ times. Complete this exercise __________ times per day.  POSTURE AND BODY MECHANICS CONSIDERATIONS - Low Back Strain Keeping correct posture when sitting, standing or completing your activities will reduce the stress put on different body tissues, allowing injured tissues a chance to heal and limiting painful experiences. The following are general guidelines for improved posture. Your physician or physical therapist will provide you with any instructions specific to your needs. While reading these guidelines, remember:  The exercises prescribed by your provider will help you have the flexibility and strength to maintain correct postures.  The correct posture provides the best environment for your joints to work. All of your joints have less wear and tear when properly supported by a spine with good posture. This means you will experience a healthier, less painful body.  Correct posture must be practiced with all of your activities, especially prolonged sitting and standing. Correct posture is as important when doing repetitive low-stress activities (typing) as it is when doing a single heavy-load activity (lifting). RESTING POSITIONS Consider which positions are most painful for you when choosing a resting position. If you have pain with flexion-based activities (sitting, bending, stooping, squatting), choose a position that allows you to rest in a less flexed posture. You would want to avoid curling into a fetal position on your side. If your pain worsens with extension-based activities (prolonged standing, working overhead), avoid resting in an extended position such as sleeping on your stomach. Most people will find more comfort when they rest with their spine in a more neutral position, neither too rounded nor too arched. Lying on a non-sagging bed on your side with a pillow  between your knees, or on your back with a pillow under your knees will often provide some relief. Keep in mind, being in any one position for a prolonged period of time, no matter how correct your posture, can still lead to stiffness. PROPER SITTING POSTURE In  order to minimize stress and discomfort on your spine, you must sit with correct posture. Sitting with good posture should be effortless for a healthy body. Returning to good posture is a gradual process. Many people can work toward this most comfortably by using various supports until they have the flexibility and strength to maintain this posture on their own. When sitting with proper posture, your ears will fall over your shoulders and your shoulders will fall over your hips. You should use the back of the chair to support your upper back. Your lower back will be in a neutral position, just slightly arched. You may place a small pillow or folded towel at the base of your lower back for support.  When working at a desk, create an environment that supports good, upright posture. Without extra support, muscles tire, which leads to excessive strain on joints and other tissues. Keep these recommendations in mind: CHAIR:  A chair should be able to slide under your desk when your back makes contact with the back of the chair. This allows you to work closely.  The chair's height should allow your eyes to be level with the upper part of your monitor and your hands to be slightly lower than your elbows. BODY POSITION  Your feet should make contact with the floor. If this is not possible, use a foot rest.  Keep your ears over your shoulders. This will reduce stress on your neck and lower back. INCORRECT SITTING POSTURES  If you are feeling tired and unable to assume a healthy sitting posture, do not slouch or slump. This puts excessive strain on your back tissues, causing more damage and pain. Healthier options include:  Using more support, like a  lumbar pillow.  Switching tasks to something that requires you to be upright or walking.  Talking a brief walk.  Lying down to rest in a neutral-spine position. PROLONGED STANDING WHILE SLIGHTLY LEANING FORWARD  When completing a task that requires you to lean forward while standing in one place for a long time, place either foot up on a stationary 2-4 inch high object to help maintain the best posture. When both feet are on the ground, the lower back tends to lose its slight inward curve. If this curve flattens (or becomes too large), then the back and your other joints will experience too much stress, tire more quickly, and can cause pain. CORRECT STANDING POSTURES Proper standing posture should be assumed with all daily activities, even if they only take a few moments, like when brushing your teeth. As in sitting, your ears should fall over your shoulders and your shoulders should fall over your hips. You should keep a slight tension in your abdominal muscles to brace your spine. Your tailbone should point down to the ground, not behind your body, resulting in an over-extended swayback posture.  INCORRECT STANDING POSTURES  Common incorrect standing postures include a forward head, locked knees and/or an excessive swayback. WALKING Walk with an upright posture. Your ears, shoulders and hips should all line-up. PROLONGED ACTIVITY IN A FLEXED POSITION When completing a task that requires you to bend forward at your waist or lean over a low surface, try to find a way to stabilize 3 out of 4 of your limbs. You can place a hand or elbow on your thigh or rest a knee on the surface you are reaching across. This will provide you more stability so that your muscles do not fatigue as quickly. By keeping your knees  relaxed, or slightly bent, you will also reduce stress across your lower back. CORRECT LIFTING TECHNIQUES DO :   Assume a wide stance. This will provide you more stability and the opportunity  to get as close as possible to the object which you are lifting.  Tense your abdominals to brace your spine. Bend at the knees and hips. Keeping your back locked in a neutral-spine position, lift using your leg muscles. Lift with your legs, keeping your back straight.  Test the weight of unknown objects before attempting to lift them.  Try to keep your elbows locked down at your sides in order get the best strength from your shoulders when carrying an object.  Always ask for help when lifting heavy or awkward objects. INCORRECT LIFTING TECHNIQUES DO NOT:   Lock your knees when lifting, even if it is a small object.  Bend and twist. Pivot at your feet or move your feet when needing to change directions.  Assume that you can safely pick up even a paper clip without proper posture.   This information is not intended to replace advice given to you by your health care provider. Make sure you discuss any questions you have with your health care provider.   Document Released: 07/16/2005 Document Revised: 08/06/2014 Document Reviewed: 10/28/2008 Else[employer] Interactive Patient Education [employer].
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
## 2015-07-15 DIAGNOSIS — Z0000 Encounter for general adult medical examination without abnormal findings: Secondary | ICD-10-CM | POA: Insufficient documentation

## 2015-07-18 ENCOUNTER — Ambulatory Visit: Payer: Medicaid Other | Attending: Primary Care | Admitting: Physical Therapy

## 2015-10-05 ENCOUNTER — Emergency Department
Admission: EM | Admit: 2015-10-05 | Discharge: 2015-10-05 | Disposition: A | Discharge status: Home / Self Care | Payer: Self-pay | Attending: Emergency Medicine | Admitting: Emergency Medicine

## 2015-10-05 ENCOUNTER — Emergency Department: Payer: Self-pay

## 2015-10-05 ENCOUNTER — Encounter: Payer: Self-pay | Admitting: Emergency Medicine

## 2015-10-05 DIAGNOSIS — Y939 Activity, unspecified: Secondary | ICD-10-CM | POA: Insufficient documentation

## 2015-10-05 DIAGNOSIS — F17200 Nicotine dependence, unspecified, uncomplicated: Secondary | ICD-10-CM | POA: Insufficient documentation

## 2015-10-05 DIAGNOSIS — J45909 Unspecified asthma, uncomplicated: Secondary | ICD-10-CM | POA: Insufficient documentation

## 2015-10-05 DIAGNOSIS — S9032XA Contusion of left foot, initial encounter: Secondary | ICD-10-CM | POA: Insufficient documentation

## 2015-10-05 DIAGNOSIS — Y929 Unspecified place or not applicable: Secondary | ICD-10-CM | POA: Insufficient documentation

## 2015-10-05 DIAGNOSIS — Y999 Unspecified external cause status: Secondary | ICD-10-CM | POA: Insufficient documentation

## 2015-10-05 DIAGNOSIS — W228XXA Striking against or struck by other objects, initial encounter: Secondary | ICD-10-CM | POA: Insufficient documentation

## 2015-10-05 IMAGING — DX DG FOOT COMPLETE 3+V*L*
3 series · 3 of 3 positions shown · non-contrast
Comparison: None.

CLINICAL DATA: Left foot pain beginning this morning. The patient
reports diving her left foot on a brick. Initial encounter.

EXAM:
LEFT FOOT - COMPLETE 3+ VIEW

[foot ap]
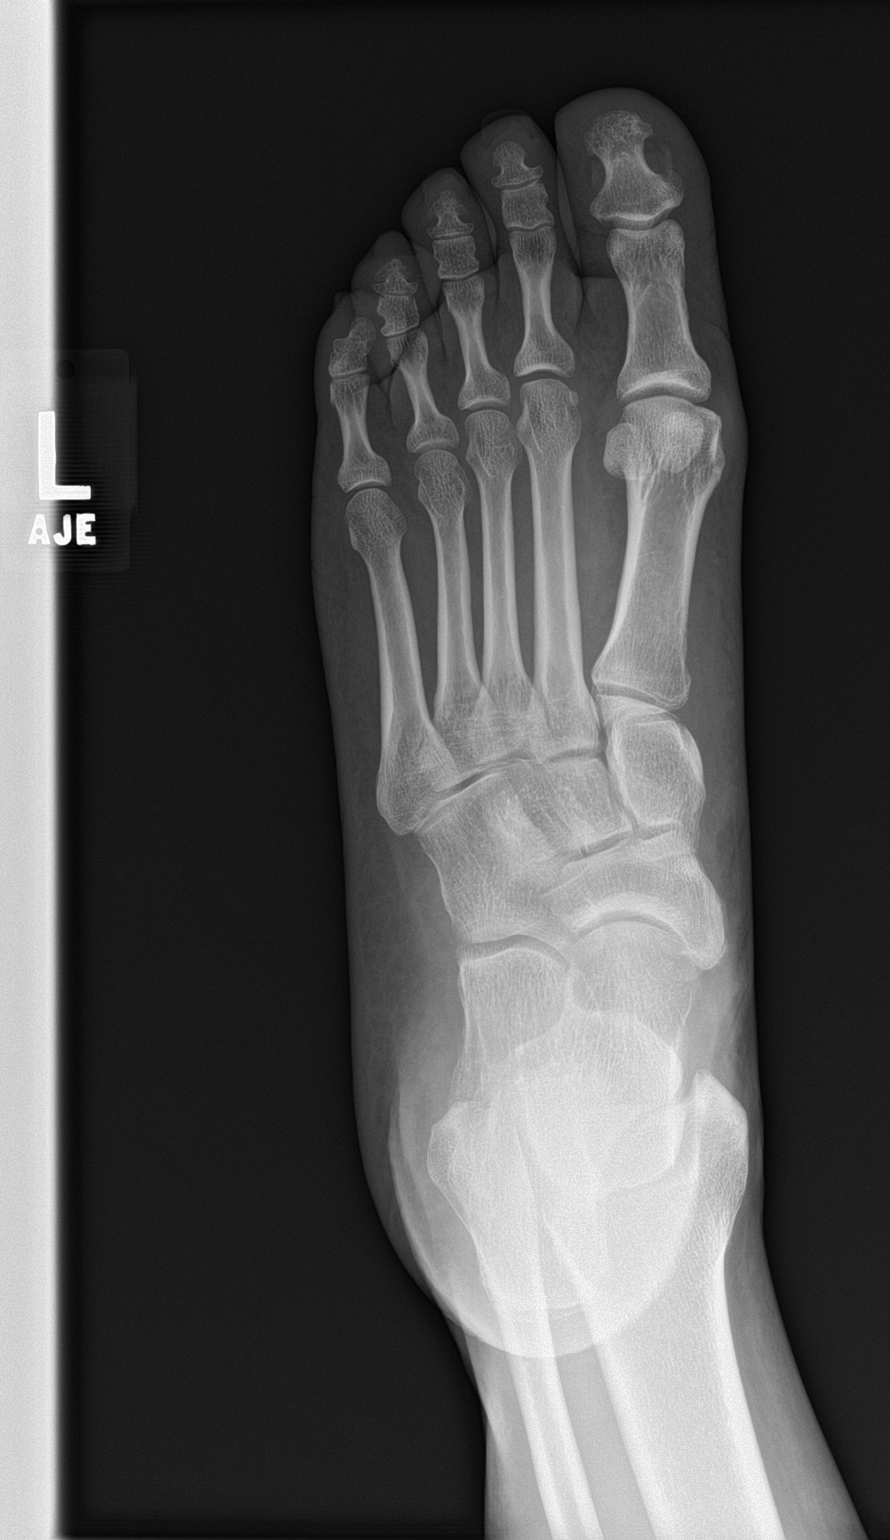

[foot obl]
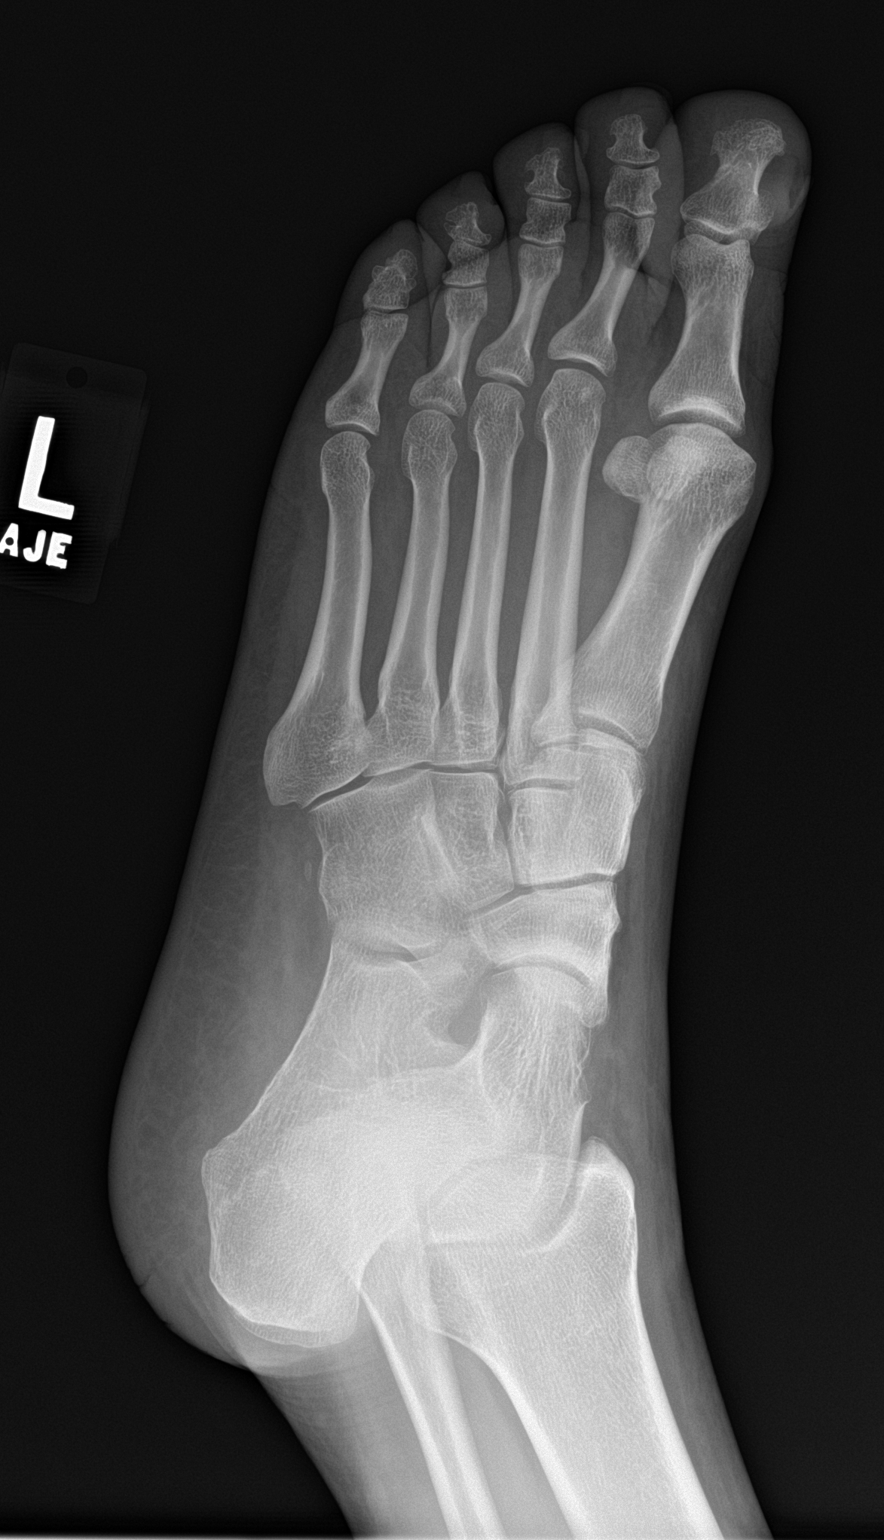

[foot lat]
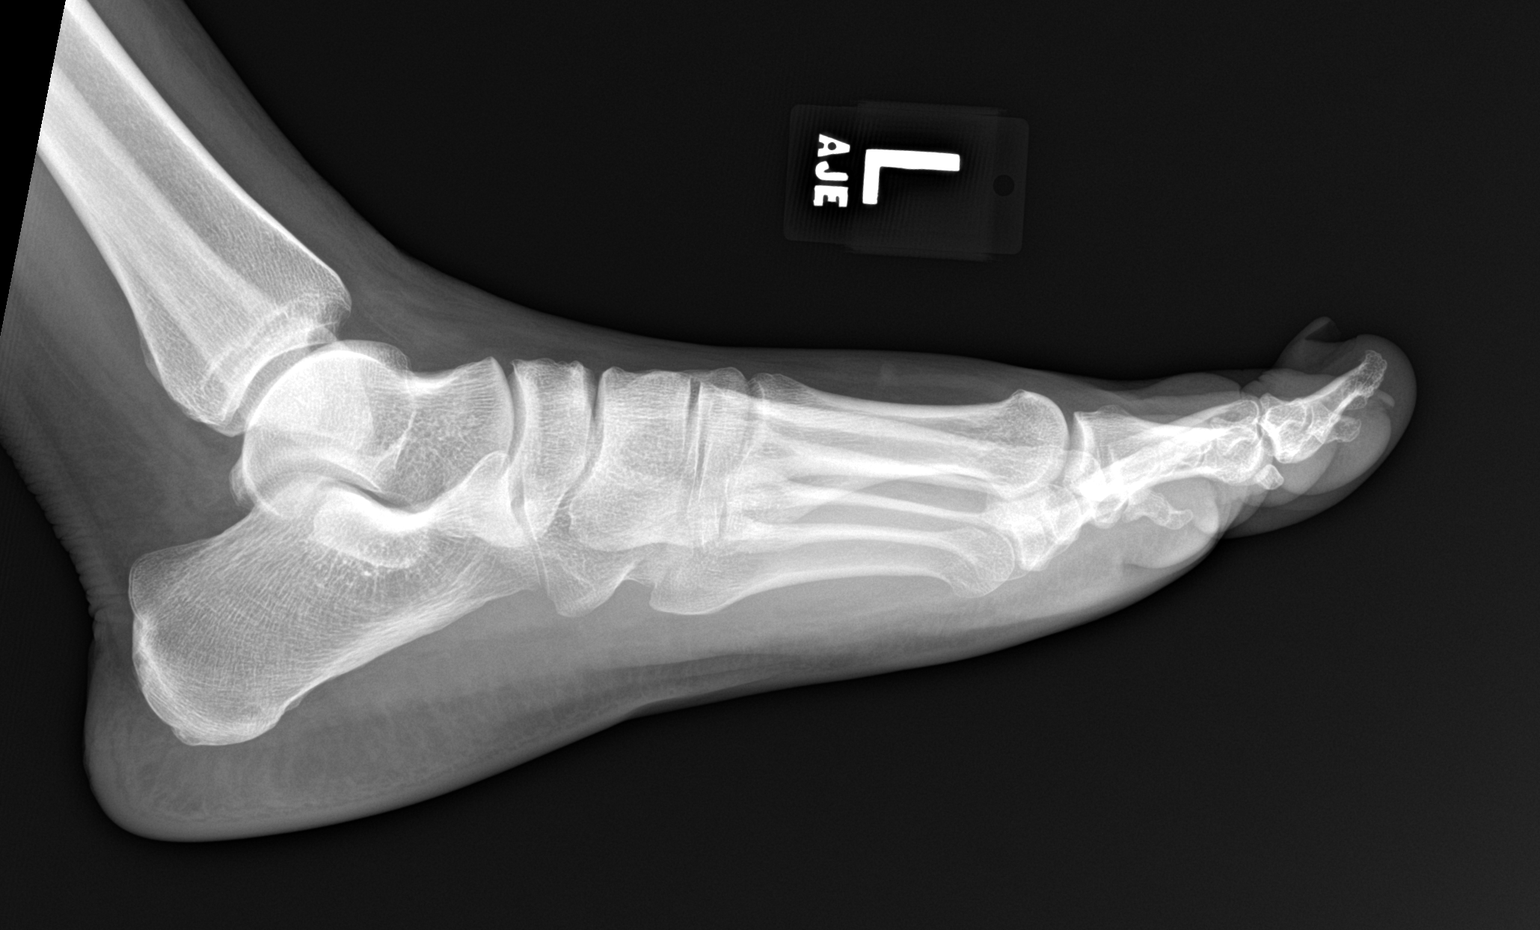

[3 of 3 positions shown; findings below may reference images not displayed]

FINDINGS: There is no evidence of fracture or dislocation. There is no
evidence of arthropathy or other focal bone abnormality. Soft
tissues are unremarkable.
IMPRESSION: Negative exam.

## 2015-10-05 MED ORDER — HYDROCODONE-ACETAMINOPHEN 5-325 MG PO TABS
2.0000 | ORAL_TABLET | Freq: Once | ORAL | Status: AC
  Administered 2015-10-05: 2 via ORAL
  Filled 2015-10-05: qty 2

## 2015-10-05 MED ORDER — IBUPROFEN 800 MG PO TABS: 800.0000 mg | ORAL_TABLET | Freq: Three times a day (TID) | ORAL | Status: DC | PRN

## 2015-10-05 NOTE — ED Provider Notes (Signed)
[facility] Emergency Department Provider Note  ____________________________________________  Time seen: Approximately 6:51 PM  I have reviewed the triage vital signs and the nursing notes.   HISTORY  Chief Complaint Foot Pain    HPI Nichole Barber is a 40 y.o. female presents for evaluation of left first second and third toe pain. Patient states that she stubbed her toe on a brick step last night and this morning she woke up the pain was severe. Abdomen difficulty time putting any weight on her left foot. States that her pain is a 8/10 at this point has not taken any medication for pain at this time. Pain is nonradiating worse with weightbearing and better with elevation.   Past Medical History  Diagnosis Date  . Asthma     There are no active problems to display for this patient.   Past Surgical History  Procedure Laterality Date  . Carpal tunnel release      Current Outpatient Rx  Name  Route  Sig  Dispense  Refill  . ibuprofen (ADVIL,MOTRIN) 800 MG tablet   Oral   Take 1 tablet (800 mg total) by mouth every 8 (eight) hours as needed.   30 tablet   0     Allergies Review of patient's allergies indicates no known allergies.  No family history on file.  Social History Social History  Substance Use Topics  . Smoking status: Current Every Day Smoker  . Smokeless tobacco: None  . Alcohol Use: No    Review of Systems  Musculoskeletal: Left foot first through third toe pain Skin: Negative for rash. No ecchymosis or bruising noted Neurological: Negative for headaches, focal weakness or numbness. Distally neurovascularly intact to the left foot  10-point ROS otherwise negative.  ____________________________________________   PHYSICAL EXAM:  VITAL SIGNS: ED Triage Vitals  Enc Vitals Group     BP 10/05/15 1843 126/75 mmHg     Pulse Rate 10/05/15 1843 99     Resp 10/05/15 1843 18     Temp 10/05/15 1843 98.8 F (37.1 C)   Temp Source 10/05/15 1843 Oral     SpO2 10/05/15 1843 100 %     Weight 10/05/15 1843 147 lb (66.679 kg)     Height 10/05/15 1843 5\' 5"  (1.651 m)     Head Cir --      Peak Flow --      Pain Score 10/05/15 1833 8     Pain Loc --      Pain Edu? --      Excl. in [place]? --     Constitutional: Alert and oriented. Well appearing and in no acute distress. Musculoskeletal: Point tenderness noted to the first second and third toes of the left foot Neurologic:  Normal speech and language. No gross focal neurologic deficits are appreciated. Positive gait instability secondary to pain and left toes. Skin:  Skin is warm, dry and intact. No rash noted. No ecchymosis or bruising left foot.  Psychiatric: Mood and affect are normal. Speech and behavior are normal.  ____________________________________________   LABS (all labs ordered are listed, but only abnormal results are displayed)  Labs Reviewed - No data to display   RADIOLOGY  No acute osseous findings no fractures or dislocations noted. ____________________________________________   PROCEDURES  Procedure(s) performed: None  Critical Care performed: No  ____________________________________________   INITIAL IMPRESSION / ASSESSMENT AND PLAN / ED COURSE  Pertinent labs & imaging results that were available during my care of the patient  were reviewed by me and considered in my medical decision making (see chart for details).  Acute left foot contusion reassurance provided to the patient. Rx given for Motrin 800 mg 3 times a day work excuse 24 hours given. Patient follow-up PCP or return here with any worsening symptomology. ____________________________________________   FINAL CLINICAL IMPRESSION(S) / ED DIAGNOSES  Final diagnoses:  Foot contusion, left, initial encounter     This chart was dictated using voice recognition software/Dragon. Despite best efforts to proofread, errors can occur which can change the meaning. Any  change was purely unintentional.   Arlyss Repress, PA-C 10/05/15 [place], MD 10/05/15 2055

## 2015-10-05 NOTE — Discharge Instructions (Signed)

## 2015-10-05 NOTE — ED Notes (Signed)
States she hit left foot on brick wall   Having pain to toes

## 2016-06-24 ENCOUNTER — Encounter: Payer: Self-pay | Admitting: Emergency Medicine

## 2016-06-24 ENCOUNTER — Emergency Department
Admission: EM | Admit: 2016-06-24 | Discharge: 2016-06-24 | Disposition: A | Discharge status: Home / Self Care | Payer: Worker's Compensation | Attending: Emergency Medicine | Admitting: Emergency Medicine

## 2016-06-24 DIAGNOSIS — Y929 Unspecified place or not applicable: Secondary | ICD-10-CM | POA: Diagnosis not present

## 2016-06-24 DIAGNOSIS — Y9389 Activity, other specified: Secondary | ICD-10-CM | POA: Insufficient documentation

## 2016-06-24 DIAGNOSIS — S61011A Laceration without foreign body of right thumb without damage to nail, initial encounter: Secondary | ICD-10-CM | POA: Diagnosis not present

## 2016-06-24 DIAGNOSIS — Z23 Encounter for immunization: Secondary | ICD-10-CM | POA: Diagnosis not present

## 2016-06-24 DIAGNOSIS — S6991XA Unspecified injury of right wrist, hand and finger(s), initial encounter: Secondary | ICD-10-CM | POA: Diagnosis present

## 2016-06-24 DIAGNOSIS — W268XXA Contact with other sharp object(s), not elsewhere classified, initial encounter: Secondary | ICD-10-CM | POA: Diagnosis not present

## 2016-06-24 DIAGNOSIS — J45909 Unspecified asthma, uncomplicated: Secondary | ICD-10-CM | POA: Diagnosis not present

## 2016-06-24 DIAGNOSIS — Y990 Civilian activity done for income or pay: Secondary | ICD-10-CM | POA: Diagnosis not present

## 2016-06-24 DIAGNOSIS — F17200 Nicotine dependence, unspecified, uncomplicated: Secondary | ICD-10-CM | POA: Diagnosis not present

## 2016-06-24 MED ORDER — TETANUS-DIPHTH-ACELL PERTUSSIS 5-2.5-18.5 LF-MCG/0.5 IM SUSP
0.5000 mL | Freq: Once | INTRAMUSCULAR | Status: AC
  Administered 2016-06-24: 0.5 mL via INTRAMUSCULAR
  Filled 2016-06-24: qty 0.5

## 2016-06-24 MED ORDER — LIDOCAINE-EPINEPHRINE-TETRACAINE (LET) SOLUTION
3.0000 mL | Freq: Once | NASAL | Status: AC
  Administered 2016-06-24: 3 mL via TOPICAL
  Filled 2016-06-24: qty 3

## 2016-06-24 MED ORDER — LIDOCAINE HCL (PF) 1 % IJ SOLN
INTRAMUSCULAR | Status: AC
  Filled 2016-06-24: qty 5

## 2016-06-24 MED ORDER — LIDOCAINE HCL (PF) 1 % IJ SOLN: 5.0000 mL | Freq: Once | INTRAMUSCULAR | Status: DC

## 2016-06-24 MED ORDER — IBUPROFEN 600 MG PO TABS
600.0000 mg | ORAL_TABLET | Freq: Once | ORAL | Status: AC
  Administered 2016-06-24: 600 mg via ORAL
  Filled 2016-06-24: qty 1

## 2016-06-24 MED ORDER — BACITRACIN ZINC 500 UNIT/GM EX OINT
TOPICAL_OINTMENT | CUTANEOUS | Status: DC
Start: 2016-06-24 — End: 2016-06-24
  Filled 2016-06-24: qty 0.9

## 2016-06-24 MED ORDER — TRAMADOL HCL 50 MG PO TABS
50.0000 mg | ORAL_TABLET | Freq: Once | ORAL | Status: AC
  Administered 2016-06-24: 50 mg via ORAL
  Filled 2016-06-24: qty 1

## 2016-06-24 MED ORDER — BACITRACIN ZINC 500 UNIT/GM EX OINT: TOPICAL_OINTMENT | CUTANEOUS | 0 refills | Status: DC

## 2016-06-24 NOTE — ED Notes (Signed)
Pt. Given work excuse.

## 2016-06-24 NOTE — ED Triage Notes (Signed)
Patient cut her right thumb at work. Patient with laceration to right thumb, bleeding controlled at this time.

## 2016-06-24 NOTE — ED Notes (Signed)
Unable to do [employer] Due to pt. Not having ID.

## 2016-06-24 NOTE — Discharge Instructions (Signed)
Please continue to apply bacitracin twice daily and have sutures removed in 7-10 days

## 2016-06-24 NOTE — ED Notes (Signed)
Let applied to rt. Finger at 2:52

## 2016-06-24 NOTE — ED Notes (Signed)
Pt. States around 11 pm tonight she cut her rt. Thumb on equipment at work.  Bleeding controlled at this time.

## 2016-06-24 NOTE — ED Provider Notes (Signed)
[facility] Emergency Department Provider Note   ____________________________________________   First MD Initiated Contact with Patient 06/24/16 [phone]     (approximate)  I have reviewed the triage vital signs and the nursing notes.   HISTORY  Chief Complaint Laceration (Pt. has cut to rt. thumb from work.)    HPI Nichole Barber is a 40 y.o. female comes into the hospital today with a right thumb injury. The patient reports that she was washing out the fry filter and let it down. She reports that when she was doing that she cut her finger. It bled a lot. She reports that she soaked multiple amounts of gauze. The patient rates her pain a 10 out of 10 in intensity and her last tetanus shot was about 12 years ago. The patient was brought in for evaluation and repair of her laceration.   Past Medical History:  Diagnosis Date  . Asthma     There are no active problems to display for this patient.   Past Surgical History:  Procedure Laterality Date  . CARPAL TUNNEL RELEASE    . CESAREAN SECTION    . KNEE SURGERY      Prior to Admission medications   Medication Sig Start Date End Date Taking? Authorizing Provider  bacitracin ointment Apply to affected area twice daily 06/24/16 06/24/17  Loney Hering, MD  ibuprofen (ADVIL,MOTRIN) 800 MG tablet Take 1 tablet (800 mg total) by mouth every 8 (eight) hours as needed. 10/05/15   Arlyss Repress, PA-C    Allergies Patient has no known allergies.  No family history on file.  Social History Social History  Substance Use Topics  . Smoking status: Current Every Day Smoker  . Smokeless tobacco: Never Used  . Alcohol use No    Review of Systems Constitutional: No fever/chills Eyes: No visual changes. ENT: No sore throat. Cardiovascular: Denies chest pain. Respiratory: Denies shortness of breath. Gastrointestinal: No abdominal pain.  No nausea, no vomiting.  No diarrhea.  No  constipation. Genitourinary: Negative for dysuria. Musculoskeletal: Negative for back pain. Skin: laceration Neurological: Negative for headaches, focal weakness or numbness.  10-point ROS otherwise negative.  ____________________________________________   PHYSICAL EXAM:  VITAL SIGNS: ED Triage Vitals  Enc Vitals Group     BP 06/24/16 0208 132/68     Pulse Rate 06/24/16 0208 86     Resp 06/24/16 0208 16     Temp 06/24/16 0208 98.4 F (36.9 C)     Temp src --      SpO2 06/24/16 0208 100 %     Weight 06/24/16 0022 150 lb (68 kg)     Height 06/24/16 0022 5\' 5"  (1.651 m)     Head Circumference --      Peak Flow --      Pain Score 06/24/16 0022 10     Pain Loc --      Pain Edu? --      Excl. in [place]? --     Constitutional: Alert and oriented. Well appearing and in mild distress. Eyes: Conjunctivae are normal. PERRL. EOMI. Head: Atraumatic. Nose: No congestion/rhinnorhea. Mouth/Throat: Mucous membranes are moist.  Oropharynx non-erythematous. Cardiovascular: Normal rate, regular rhythm. Grossly normal heart sounds.  Good peripheral circulation. Respiratory: Normal respiratory effort.  No retractions. Lungs CTAB. Gastrointestinal: Soft and nontender. No distention. Positive bowel sounds Musculoskeletal: No lower extremity edema.   Neurologic:  Normal speech and language. No gross focal neurologic deficits are appreciated. No gait instability. Skin:  Laceration to the palmar surface of the right thumb approximately 4-1/2 cm. Psychiatric: Mood and affect are normal.  ____________________________________________   LABS (all labs ordered are listed, but only abnormal results are displayed)  Labs Reviewed - No data to display ____________________________________________  EKG  none ____________________________________________  RADIOLOGY  none ____________________________________________   PROCEDURES  Procedure(s) performed: please, see procedure  note(s).  Marland Kitchen.Laceration Repair Date/Time: 06/24/2016 4:20 AM Performed by: Loney Hering Authorized by: Loney Hering   Consent:    Consent obtained:  Verbal   Consent given by:  Patient   Risks discussed:  Infection and pain Anesthesia (see MAR for exact dosages):    Anesthesia method:  Topical application and local infiltration   Topical anesthetic:  LET   Local anesthetic:  Lidocaine 1% w/o epi Laceration details:    Location:  Finger   Finger location:  R thumb   Length (cm):  4.5 Repair type:    Repair type:  Simple Pre-procedure details:    Preparation:  Patient was prepped and draped in usual sterile fashion Exploration:    Hemostasis achieved with:  Direct pressure   Contaminated: no   Treatment:    Area cleansed with:  Betadine   Amount of cleaning:  Standard   Irrigation method:  Syringe Skin repair:    Repair method:  Sutures   Suture size:  5-0   Suture material:  Prolene   Suture technique:  Simple interrupted   Number of sutures:  3 Approximation:    Approximation:  Close Post-procedure details:    Dressing:  Antibiotic ointment and sterile dressing   Patient tolerance of procedure:  Tolerated well, no immediate complications    Critical Care performed: No  ____________________________________________   INITIAL IMPRESSION / ASSESSMENT AND PLAN / ED COURSE  Pertinent labs & imaging results that were available during my care of the patient were reviewed by me and considered in my medical decision making (see chart for details).  This is a 40 year old female who comes into the hospital today with an injury to her right thumb. The area looks well and laceration can be well approximated. I was able to suture the patient's wound without any difficulty. I did give her a dose of ibuprofen as well as tramadol for pain to her finger. The patient received a TD As well as some bacitracin. She will be discharged and has been instructed to have her  sutures removed in approximately 7-10 days.  Clinical Course      ____________________________________________   FINAL CLINICAL IMPRESSION(S) / ED DIAGNOSES  Final diagnoses:  Laceration of right thumb without foreign body without damage to nail, initial encounter      NEW MEDICATIONS STARTED DURING THIS VISIT:  Discharge Medication List as of 06/24/2016  4:39 AM    START taking these medications   Details  bacitracin ointment Apply to affected area twice daily, Print         Note:  This document was prepared using Dragon voice recognition software and may include unintentional dictation errors.    Loney Hering, MD 06/24/16 [phone]

## 2016-06-30 ENCOUNTER — Encounter: Payer: Self-pay | Admitting: Emergency Medicine

## 2016-06-30 ENCOUNTER — Emergency Department
Admission: EM | Admit: 2016-06-30 | Discharge: 2016-06-30 | Disposition: A | Discharge status: Home / Self Care | Payer: Worker's Compensation | Attending: Emergency Medicine | Admitting: Emergency Medicine

## 2016-06-30 DIAGNOSIS — F17200 Nicotine dependence, unspecified, uncomplicated: Secondary | ICD-10-CM | POA: Insufficient documentation

## 2016-06-30 DIAGNOSIS — J45909 Unspecified asthma, uncomplicated: Secondary | ICD-10-CM | POA: Insufficient documentation

## 2016-06-30 DIAGNOSIS — T148XXA Other injury of unspecified body region, initial encounter: Secondary | ICD-10-CM

## 2016-06-30 DIAGNOSIS — L089 Local infection of the skin and subcutaneous tissue, unspecified: Secondary | ICD-10-CM | POA: Diagnosis present

## 2016-06-30 MED ORDER — CEPHALEXIN 500 MG PO CAPS: 500.0000 mg | ORAL_CAPSULE | Freq: Three times a day (TID) | ORAL | 0 refills | Status: AC

## 2016-06-30 NOTE — ED Triage Notes (Signed)
Pt states she cut her hand at work, received sutures on Nov 26th.  Now states pain and swelling in hand.

## 2016-06-30 NOTE — ED Provider Notes (Signed)
[facility] Emergency Department Provider Note  ____________________________________________  Time seen: Approximately 11:33 AM  I have reviewed the triage vital signs and the nursing notes.   HISTORY  Chief Complaint Hand Pain  HPI Sheran Dicola is a 40 y.o. female who presents to the emergency department for a wound check. She cut her thumb on the fry filter at work on Jun 24, 2016 and sutures were inserted. She states that she has had some white drainage from the proximal edge of the wound and has had an increase in pain and swelling. She is taking ibuprofen without relief.  Past Medical History:  Diagnosis Date  . Asthma     There are no active problems to display for this patient.   Past Surgical History:  Procedure Laterality Date  . CARPAL TUNNEL RELEASE    . CESAREAN SECTION    . KNEE SURGERY      Prior to Admission medications   Medication Sig Start Date End Date Taking? Authorizing Provider  bacitracin ointment Apply to affected area twice daily 06/24/16 06/24/17  Loney Hering, MD  cephALEXin (KEFLEX) 500 MG capsule Take 1 capsule (500 mg total) by mouth 3 (three) times daily. 06/30/16 07/10/16  Victorino Dike, FNP  ibuprofen (ADVIL,MOTRIN) 800 MG tablet Take 1 tablet (800 mg total) by mouth every 8 (eight) hours as needed. 10/05/15   Arlyss Repress, PA-C    Allergies Patient has no known allergies.  History reviewed. No pertinent family history.  Social History Social History  Substance Use Topics  . Smoking status: Current Every Day Smoker  . Smokeless tobacco: Never Used  . Alcohol use No    Review of Systems Constitutional: No fever/chills Cardiovascular: Denies chest pain. Respiratory: Negative for cough. Gastrointestinal: No abdominal pain.  No nausea, no vomiting.  Musculoskeletal: Negative for back pain. Skin: Positive for sutured wound on the pad of the right thumb. Neurological: Negative for headaches, focal  weakness or numbness. ___________________________________________   PHYSICAL EXAM:  VITAL SIGNS: ED Triage Vitals  Enc Vitals Group     BP 06/30/16 1119 129/85     Pulse Rate 06/30/16 1119 88     Resp 06/30/16 1119 16     Temp --      Temp src --      SpO2 06/30/16 1119 97 %     Weight 06/30/16 1045 150 lb (68 kg)     Height --      Head Circumference --      Peak Flow --      Pain Score 06/30/16 1045 8     Pain Loc --      Pain Edu? --      Excl. in [place]? --     Constitutional: Alert and oriented. Well appearing and in no acute distress. Eyes: Conjunctivae are normal. Nose: No congestion/rhinnorhea. Mouth/Throat: Mucous membranes are moist.  Cardiovascular:Good peripheral circulation. Respiratory: Normal respiratory effort.  No retractions. Musculoskeletal: Full ROM throughout, specifically the right thumb. Neurologic:  Normal speech and language. No gross focal neurologic deficits are appreciated. Speech is normal. No gait instability. Skin: Wound edges well approximated. No drainage visualized. No erythema or edema noted.   Psychiatric: Mood and affect are normal. Speech and behavior are normal.  ____________________________________________   LABS (all labs ordered are listed, but only abnormal results are displayed)  Labs Reviewed - No data to display ____________________________________________  RADIOLOGY  Not indicated ____________________________________________   PROCEDURES  Procedure(s) performed: None  ___________________________________________   INITIAL IMPRESSION / ASSESSMENT AND PLAN / ED COURSE  Pertinent labs & imaging results that were available during my care of the patient were reviewed by me and considered in my medical decision making (see chart for details).  Sutured area appears to be healing well, but based on patient's claim of having drainage and increase in pain, will cover for infection. She was advised to have the sutures removed  as previously advised and to follow up with the hand specialist at [place]. Strict return precautions were given.   Patient was advised to follow up with the primary care provider for symptoms of concern or return to the emergency department if unable to schedule an appointment. ____________________________________________   FINAL CLINICAL IMPRESSION(S) / ED DIAGNOSES  Final diagnoses:  Wound infection    Note:  This document was prepared using Dragon voice recognition software and may include unintentional dictation errors.     Victorino Dike, FNP 07/01/16 1307    Lavonia Drafts, MD 07/02/16 [phone]

## 2016-06-30 NOTE — Discharge Instructions (Signed)
Schedule an appointment with the hand specialist if swelling and pain does not improve over the next 2 days. Return to the ER for symptoms that change or worsen if unable to schedule an appointment.

## 2016-07-05 DIAGNOSIS — M79641 Pain in right hand: Secondary | ICD-10-CM | POA: Insufficient documentation

## 2017-02-22 ENCOUNTER — Emergency Department
Admission: EM | Admit: 2017-02-22 | Discharge: 2017-02-22 | Disposition: A | Discharge status: Home / Self Care | Payer: Medicaid Other | Attending: Emergency Medicine | Admitting: Emergency Medicine

## 2017-02-22 ENCOUNTER — Encounter: Payer: Self-pay | Admitting: Emergency Medicine

## 2017-02-22 DIAGNOSIS — J45909 Unspecified asthma, uncomplicated: Secondary | ICD-10-CM | POA: Insufficient documentation

## 2017-02-22 DIAGNOSIS — K529 Noninfective gastroenteritis and colitis, unspecified: Secondary | ICD-10-CM | POA: Insufficient documentation

## 2017-02-22 DIAGNOSIS — F17200 Nicotine dependence, unspecified, uncomplicated: Secondary | ICD-10-CM | POA: Insufficient documentation

## 2017-02-22 DIAGNOSIS — R112 Nausea with vomiting, unspecified: Secondary | ICD-10-CM

## 2017-02-22 DIAGNOSIS — D509 Iron deficiency anemia, unspecified: Secondary | ICD-10-CM | POA: Insufficient documentation

## 2017-02-22 DIAGNOSIS — R197 Diarrhea, unspecified: Secondary | ICD-10-CM

## 2017-02-22 LAB — COMPREHENSIVE METABOLIC PANEL
ALBUMIN: 3.4 g/dL — AB (ref 3.5–5.0)
ALK PHOS: 84 U/L (ref 38–126)
ALT: 11 U/L — AB (ref 14–54)
ANION GAP: 6 (ref 5–15)
AST: 21 U/L (ref 15–41)
BILIRUBIN TOTAL: 0.2 mg/dL — AB (ref 0.3–1.2)
BUN: 8 mg/dL (ref 6–20)
CALCIUM: 8.6 mg/dL — AB (ref 8.9–10.3)
CO2: 26 mmol/L (ref 22–32)
CREATININE: 0.65 mg/dL (ref 0.44–1.00)
Chloride: 106 mmol/L (ref 101–111)
GFR calc Af Amer: >60 mL/min (ref >60)
GFR calc non Af Amer: >60 mL/min (ref >60)
GLUCOSE: 94 mg/dL (ref 65–99)
Potassium: 3.6 mmol/L (ref 3.5–5.1)
SODIUM: 138 mmol/L (ref 135–145)
TOTAL PROTEIN: 7.6 g/dL (ref 6.5–8.1)

## 2017-02-22 LAB — IRON AND TIBC
Iron: 7 ug/dL — ABNORMAL LOW (ref 28–170)
Saturation Ratios: 2 % — ABNORMAL LOW (ref 10.4–31.8)
TIBC: 431 ug/dL (ref 250–450)
UIBC: 424 ug/dL

## 2017-02-22 LAB — URINALYSIS, COMPLETE (UACMP) WITH MICROSCOPIC
Bacteria, UA: NONE SEEN
Bilirubin Urine: NEGATIVE
GLUCOSE, UA: NEGATIVE mg/dL
Hgb urine dipstick: NEGATIVE
Ketones, ur: NEGATIVE mg/dL
Leukocytes, UA: NEGATIVE
Nitrite: NEGATIVE
PH: 8 (ref 5.0–8.0)
Protein, ur: NEGATIVE mg/dL
RBC / HPF: NONE SEEN RBC/hpf (ref 0–5)
SPECIFIC GRAVITY, URINE: 1.02 (ref 1.005–1.030)

## 2017-02-22 LAB — CBC
HCT: 23.5 % — ABNORMAL LOW (ref 35.0–47.0)
HEMOGLOBIN: 6.8 g/dL — AB (ref 12.0–16.0)
MCH: 16.6 pg — AB (ref 26.0–34.0)
MCHC: 28.8 g/dL — AB (ref 32.0–36.0)
MCV: 57.6 fL — ABNORMAL LOW (ref 80.0–100.0)
PLATELETS: 889 10*3/uL — AB (ref 150–440)
RBC: 4.08 MIL/uL (ref 3.80–5.20)
RDW: 20 % — AB (ref 11.5–14.5)
WBC: 5.1 10*3/uL (ref 3.6–11.0)

## 2017-02-22 LAB — POCT PREGNANCY, URINE: Preg Test, Ur: NEGATIVE

## 2017-02-22 LAB — LIPASE, BLOOD: Lipase: 23 U/L (ref 11–51)

## 2017-02-22 MED ORDER — ONDANSETRON 4 MG PO TBDP
ORAL_TABLET | ORAL | Status: AC
  Filled 2017-02-22: qty 1

## 2017-02-22 MED ORDER — FERROUS SULFATE DRIED ER 160 (50 FE) MG PO TBCR: 160.0000 mg | EXTENDED_RELEASE_TABLET | Freq: Every day | ORAL | 6 refills | Status: AC

## 2017-02-22 MED ORDER — ONDANSETRON 4 MG PO TBDP: 4.0000 mg | ORAL_TABLET | Freq: Three times a day (TID) | ORAL | 0 refills | Status: DC | PRN

## 2017-02-22 MED ORDER — LOPERAMIDE HCL 2 MG PO CAPS
4.0000 mg | ORAL_CAPSULE | Freq: Once | ORAL | Status: AC
  Administered 2017-02-22: 4 mg via ORAL
  Filled 2017-02-22: qty 2

## 2017-02-22 MED ORDER — ONDANSETRON 4 MG PO TBDP
4.0000 mg | ORAL_TABLET | Freq: Once | ORAL | Status: AC
  Administered 2017-02-22: 4 mg via ORAL

## 2017-02-22 NOTE — ED Triage Notes (Signed)
Patient presents to the ED with sharp lower abdominal pain radiating into her back, nausea, vomiting and diarrhea x 2 days.  Patient is also complaining of right ear pain and sore throat.  Patient reports vomiting approx. 4 times in the past 24 hours and diarrhea x 8.  Patient states ear is tender but is improving.

## 2017-02-22 NOTE — ED Provider Notes (Signed)
[facility] Emergency Department Provider Note       Time seen: ----------------------------------------- 12:54 PM on 02/22/2017 -----------------------------------------     I have reviewed the triage vital signs and the nursing notes.   HISTORY   Chief Complaint Emesis; Diarrhea; and Abdominal Pain    HPI Nichole Barber is a 41 y.o. female who presents to the ED for dental pain radiating into her back. She's had nausea, vomiting and diarrhea for the past 2 days. She is also complaining of sore throat and ear pain. She states she has thrown up 4 times in the past 24 hours has had 8 episodes of diarrhea as well.   Past Medical History:  Diagnosis Date  . Asthma     There are no active problems to display for this patient.   Past Surgical History:  Procedure Laterality Date  . CARPAL TUNNEL RELEASE    . CESAREAN SECTION    . KNEE SURGERY      Allergies Patient has no known allergies.  Social History Social History  Substance Use Topics  . Smoking status: Current Every Day Smoker  . Smokeless tobacco: Never Used  . Alcohol use No    Review of Systems Constitutional: Negative for fever. Eyes: Negative for vision changes ENT:  Negative for congestion, sore throat Cardiovascular: Negative for chest pain. Respiratory: Negative for shortness of breath. Gastrointestinal: Positive for abdominal pain, vomiting and diarrhea Genitourinary: Negative for dysuria. Musculoskeletal: Negative for back pain. Skin: Negative for rash. Neurological: Negative for headaches, focal weakness or numbness.  All systems negative/normal/unremarkable except as stated in the HPI  ____________________________________________   PHYSICAL EXAM:  VITAL SIGNS: ED Triage Vitals  Enc Vitals Group     BP 02/22/17 1017 130/85     Pulse Rate 02/22/17 1017 (!) 101     Resp 02/22/17 1017 18     Temp 02/22/17 1017 99 F (37.2 C)     Temp Source 02/22/17 1017  Oral     SpO2 02/22/17 1017 100 %     Weight 02/22/17 1017 148 lb (67.1 kg)     Height 02/22/17 1017 5\' 5"  (1.651 m)     Head Circumference --      Peak Flow --      Pain Score 02/22/17 1023 8     Pain Loc --      Pain Edu? --      Excl. in [place]? --     Constitutional: Alert and oriented. Well appearing and in no distress. Eyes: Conjunctivae are normal. Normal extraocular movements. ENT   Head: Normocephalic and atraumatic.   Nose: No congestion/rhinnorhea.   Mouth/Throat: Mucous membranes are moist.   Neck: No stridor. Cardiovascular: Normal rate, regular rhythm. No murmurs, rubs, or gallops. Respiratory: Normal respiratory effort without tachypnea nor retractions. Breath sounds are clear and equal bilaterally. No wheezes/rales/rhonchi. Gastrointestinal: Soft and nontender. Normal bowel sounds Musculoskeletal: Nontender with normal range of motion in extremities. No lower extremity tenderness nor edema. Neurologic:  Normal speech and language. No gross focal neurologic deficits are appreciated.  Skin:  Skin is warm, dry and intact. No rash noted. Psychiatric: Mood and affect are normal. Speech and behavior are normal.  ____________________________________________  ED COURSE:  Pertinent labs & imaging results that were available during my care of the patient were reviewed by me and considered in my medical decision making (see chart for details). Patient presents for symptoms of gastroenteritis, we will assess with labs and imaging as indicated.  Procedures ____________________________________________   LABS (pertinent positives/negatives)  Labs Reviewed  COMPREHENSIVE METABOLIC PANEL - Abnormal; Notable for the following:       Result Value   Calcium 8.6 (*)    Albumin 3.4 (*)    ALT 11 (*)    Total Bilirubin 0.2 (*)    All other components within normal limits  CBC - Abnormal; Notable for the following:    Hemoglobin 6.8 (*)    HCT 23.5 (*)    MCV 57.6  (*)    MCH 16.6 (*)    MCHC 28.8 (*)    RDW 20.0 (*)    Platelets 889 (*)    All other components within normal limits  URINALYSIS, COMPLETE (UACMP) WITH MICROSCOPIC - Abnormal; Notable for the following:    Color, Urine YELLOW (*)    APPearance CLEAR (*)    Squamous Epithelial / LPF 0-5 (*)    All other components within normal limits  LIPASE, BLOOD  POCT PREGNANCY, URINE   ____________________________________________  FINAL ASSESSMENT AND PLAN  Gastroenteritis, anemia  Plan: Patient's labs were dictated above. Patient had presented for symptoms of gastroenteritis which was relieved with Zofran. She was found to have a chronic anemia. Hemoglobin from 2014 was 7. It does appear to be iron deficiency and I will start her on iron as well as continue her on medication for gastroenteritis.   Earleen Newport, MD   Note: This note was generated in part or whole with voice recognition software. Voice recognition is usually quite accurate but there are transcription errors that can and very often do occur. I apologize for any typographical errors that were not detected and corrected.     Earleen Newport, MD 02/22/17 1310

## 2017-02-22 NOTE — ED Notes (Signed)
ED Provider at bedside. 

## 2017-05-01 ENCOUNTER — Emergency Department
Admission: EM | Admit: 2017-05-01 | Discharge: 2017-05-01 | Disposition: A | Discharge status: Home / Self Care | Payer: Self-pay | Attending: Emergency Medicine | Admitting: Emergency Medicine

## 2017-05-01 DIAGNOSIS — R112 Nausea with vomiting, unspecified: Secondary | ICD-10-CM | POA: Insufficient documentation

## 2017-05-01 DIAGNOSIS — F17200 Nicotine dependence, unspecified, uncomplicated: Secondary | ICD-10-CM | POA: Insufficient documentation

## 2017-05-01 DIAGNOSIS — J45909 Unspecified asthma, uncomplicated: Secondary | ICD-10-CM | POA: Insufficient documentation

## 2017-05-01 DIAGNOSIS — Z79899 Other long term (current) drug therapy: Secondary | ICD-10-CM | POA: Insufficient documentation

## 2017-05-01 DIAGNOSIS — R197 Diarrhea, unspecified: Secondary | ICD-10-CM | POA: Insufficient documentation

## 2017-05-01 LAB — URINE DRUG SCREEN, QUALITATIVE (ARMC ONLY)
Amphetamines, Ur Screen: NOT DETECTED
BARBITURATES, UR SCREEN: NOT DETECTED
Benzodiazepine, Ur Scrn: NOT DETECTED
CANNABINOID 50 NG, UR ~~LOC~~: NOT DETECTED
COCAINE METABOLITE, UR ~~LOC~~: NOT DETECTED
MDMA (ECSTASY) UR SCREEN: NOT DETECTED
Methadone Scn, Ur: NOT DETECTED
Opiate, Ur Screen: NOT DETECTED
PHENCYCLIDINE (PCP) UR S: NOT DETECTED
Tricyclic, Ur Screen: NOT DETECTED

## 2017-05-01 LAB — URINALYSIS, COMPLETE (UACMP) WITH MICROSCOPIC
BACTERIA UA: NONE SEEN
SPECIFIC GRAVITY, URINE: 1.026 (ref 1.005–1.030)

## 2017-05-01 LAB — COMPREHENSIVE METABOLIC PANEL
ALT: 9 U/L — ABNORMAL LOW (ref 14–54)
ANION GAP: 7 (ref 5–15)
AST: 19 U/L (ref 15–41)
Albumin: 3.5 g/dL (ref 3.5–5.0)
Alkaline Phosphatase: 71 U/L (ref 38–126)
BILIRUBIN TOTAL: 0.3 mg/dL (ref 0.3–1.2)
BUN: 9 mg/dL (ref 6–20)
CHLORIDE: 105 mmol/L (ref 101–111)
CO2: 25 mmol/L (ref 22–32)
Calcium: 8.7 mg/dL — ABNORMAL LOW (ref 8.9–10.3)
Creatinine, Ser: 0.69 mg/dL (ref 0.44–1.00)
Glucose, Bld: 118 mg/dL — ABNORMAL HIGH (ref 65–99)
POTASSIUM: 3.4 mmol/L — AB (ref 3.5–5.1)
Sodium: 137 mmol/L (ref 135–145)
Total Protein: 7.7 g/dL (ref 6.5–8.1)

## 2017-05-01 LAB — CBC
HEMATOCRIT: 22.8 % — AB (ref 35.0–47.0)
Hemoglobin: 6.7 g/dL — ABNORMAL LOW (ref 12.0–16.0)
MCH: 17.8 pg — ABNORMAL LOW (ref 26.0–34.0)
MCHC: 29.5 g/dL — ABNORMAL LOW (ref 32.0–36.0)
MCV: 60.4 fL — AB (ref 80.0–100.0)
Platelets: 725 10*3/uL — ABNORMAL HIGH (ref 150–440)
RBC: 3.77 MIL/uL — AB (ref 3.80–5.20)
RDW: 20.4 % — AB (ref 11.5–14.5)
WBC: 5.1 10*3/uL (ref 3.6–11.0)

## 2017-05-01 LAB — POCT PREGNANCY, URINE: Preg Test, Ur: NEGATIVE

## 2017-05-01 LAB — LIPASE, BLOOD: LIPASE: 23 U/L (ref 11–51)

## 2017-05-01 MED ORDER — ONDANSETRON 4 MG PO TBDP
4.0000 mg | ORAL_TABLET | Freq: Once | ORAL | Status: AC
  Administered 2017-05-01: 4 mg via ORAL
  Filled 2017-05-01: qty 1

## 2017-05-01 MED ORDER — ONDANSETRON HCL 4 MG PO TABS: 4.0000 mg | ORAL_TABLET | Freq: Three times a day (TID) | ORAL | 0 refills | Status: DC | PRN

## 2017-05-01 NOTE — ED Triage Notes (Signed)
Pt arrives to ED via POV with c/o generalized abdominal pain and vomiting since 3am this morning. Pt denies CP, no SHOB, fever, or diarrhea. Pt reports "throbbing" abdominal pain, with 8-9 episodes of non-bloody emesis. 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 Pt is A&O, in NAD; RR even, regular and unlabored; skin color/temp is WNL.

## 2017-05-01 NOTE — ED Provider Notes (Addendum)
[facility] Emergency Department Provider Note  ____________________________________________   I have reviewed the triage vital signs and the nursing notes.   HISTORY  Chief Complaint Abdominal Pain and Emesis    HPI Nichole Barber is a 41 y.o. female who presents today c/o nausea and vomiting and one loose stool today. No melena bright red blood per rectum. She is on her menstrual period. Her baseline hemoglobin is 6.8. She is known to be anemic. She is not as dramatic with her. She's had no hematemesis bright red blood per rectum or melena. Positive multiple sick contacts with vomiting illness. Patient states that she is feeling much better now has not vomited in 2 hours. His been able to drink Gatorade. She would like a note for work. She denies any abdominal pain or fever.  no antecedent interventions, nothing makes it better except for the tincture of time.   Past Medical History:  Diagnosis Date  . Asthma     There are no active problems to display for this patient.   Past Surgical History:  Procedure Laterality Date  . CARPAL TUNNEL RELEASE    . CESAREAN SECTION    . KNEE SURGERY      Prior to Admission medications   Medication Sig Start Date End Date Taking? Authorizing Provider  ferrous sulfate (EQL SLOW RELEASE IRON) 160 (50 Fe) MG TBCR SR tablet Take 1 tablet (160 mg total) by mouth daily. 02/22/17   Earleen Newport, MD  ibuprofen (ADVIL,MOTRIN) 800 MG tablet Take 1 tablet (800 mg total) by mouth every 8 (eight) hours as needed. Patient not taking: Reported on 02/22/2017 10/05/15   Arlyss Repress, PA-C  ondansetron (ZOFRAN ODT) 4 MG disintegrating tablet Take 1 tablet (4 mg total) by mouth every 8 (eight) hours as needed for nausea or vomiting. 02/22/17   Earleen Newport, MD  predniSONE (DELTASONE) 10 MG tablet Take 10 mg by mouth daily. 02/05/17   [provider]    Allergies Patient has no known allergies.  No family  history on file.  Social History Social History  Substance Use Topics  . Smoking status: Current Every Day Smoker  . Smokeless tobacco: Never Used  . Alcohol use No    Review of Systems Constitutional: No fever/chills Eyes: No visual changes. ENT: No sore throat. No stiff neck no neck pain Cardiovascular: Denies chest pain. Respiratory: Denies shortness of breath. Gastrointestinal:   positivevomiting.  positivediarrhea.  No constipation. Genitourinary: Negative for dysuria. Musculoskeletal: Negative lower extremity swelling Skin: Negative for rash. Neurological: Negative for severe headaches, focal weakness or numbness.   ____________________________________________   PHYSICAL EXAM:  VITAL SIGNS: ED Triage Vitals  Enc Vitals Group     BP 05/01/17 1909 130/82     Pulse Rate 05/01/17 1909 88     Resp 05/01/17 1909 17     Temp 05/01/17 1909 99 F (37.2 C)     Temp Source 05/01/17 1909 Oral     SpO2 05/01/17 1909 100 %     Weight 05/01/17 1904 150 lb (68 kg)     Height 05/01/17 1904 5\' 5"  (1.651 m)     Head Circumference --      Peak Flow --      Pain Score 05/01/17 1903 8     Pain Loc --      Pain Edu? --      Excl. in [place]? --     Constitutional: Alert and oriented. Well appearing and in  no acute distress.patient sitting up in a chair with her legs crossed using her computer to type and in no acute distress Eyes: Conjunctivae are normal Head: Atraumatic HEENT: No congestion/rhinnorhea. Mucous membranes are moist.  Oropharynx non-erythematous Neck:   Nontender with no meningismus, no masses, no stridor Cardiovascular: Normal rate, regular rhythm. Grossly normal heart sounds.  Good peripheral circulation. Respiratory: Normal respiratory effort.  No retractions. Lungs CTAB. Abdominal: Soft and nontender. No distention. No guarding no rebound Back:  There is no focal tenderness or step off.  there is no midline tenderness there are no lesions noted. there is no CVA  tenderness Musculoskeletal: No lower extremity tenderness, no upper extremity tenderness. No joint effusions, no DVT signs strong distal pulses no edema Neurologic:  Normal speech and language. No gross focal neurologic deficits are appreciated.  Skin:  Skin is warm, dry and intact. No rash noted. Psychiatric: Mood and affect are normal. Speech and behavior are normal.  ____________________________________________   LABS (all labs ordered are listed, but only abnormal results are displayed)  Labs Reviewed  COMPREHENSIVE METABOLIC PANEL - Abnormal; Notable for the following:       Result Value   Potassium 3.4 (*)    Glucose, Bld 118 (*)    Calcium 8.7 (*)    ALT 9 (*)    All other components within normal limits  CBC - Abnormal; Notable for the following:    RBC 3.77 (*)    Hemoglobin 6.7 (*)    HCT 22.8 (*)    MCV 60.4 (*)    MCH 17.8 (*)    MCHC 29.5 (*)    RDW 20.4 (*)    Platelets 725 (*)    All other components within normal limits  URINALYSIS, COMPLETE (UACMP) WITH MICROSCOPIC - Abnormal; Notable for the following:    Color, Urine RED (*)    APPearance TURBID (*)    Glucose, UA   (*)    Value: TEST NOT REPORTED DUE TO COLOR INTERFERENCE OF URINE PIGMENT   Hgb urine dipstick   (*)    Value: TEST NOT REPORTED DUE TO COLOR INTERFERENCE OF URINE PIGMENT   Bilirubin Urine   (*)    Value: TEST NOT REPORTED DUE TO COLOR INTERFERENCE OF URINE PIGMENT   Ketones, ur   (*)    Value: TEST NOT REPORTED DUE TO COLOR INTERFERENCE OF URINE PIGMENT   Protein, ur   (*)    Value: TEST NOT REPORTED DUE TO COLOR INTERFERENCE OF URINE PIGMENT   Nitrite   (*)    Value: TEST NOT REPORTED DUE TO COLOR INTERFERENCE OF URINE PIGMENT   Leukocytes, UA   (*)    Value: TEST NOT REPORTED DUE TO COLOR INTERFERENCE OF URINE PIGMENT   Squamous Epithelial / LPF 6-30 (*)    All other components within normal limits  LIPASE, BLOOD  URINE DRUG SCREEN, QUALITATIVE ([facility] ONLY)  POC URINE PREG, ED   POCT PREGNANCY, URINE    Pertinent labs  results that were available during my care of the patient were reviewed by me and considered in my medical decision making (see chart for details). ____________________________________________  EKG  I personally interpreted any EKGs ordered by me or triage  ____________________________________________  RADIOLOGY  Pertinent labs & imaging results that were available during my care of the patient were reviewed by me and considered in my medical decision making (see chart for details). If possible, patient and/or family made aware of any abnormal findings. ____________________________________________  PROCEDURES  Procedure(s) performed: None  Procedures  Critical Care performed: None  ____________________________________________   INITIAL IMPRESSION / ASSESSMENT AND PLAN / ED COURSE  Pertinent labs & imaging results that were available during my care of the patient were reviewed by me and considered in my medical decision making (see chart for details).  issue here with vomiting and diarrhea, very well-appearing, very low suspicion for any significant intra-abdominal pathology especially given negative abdominal exam. Patient's urine is somewhat contaminated but she is on her menses. She refuses catheterization but has no symptoms of UTI and we will send a urine culture. I will give her antiemetics and ensure that she can drink. Patient has had no bleeding, her hemoglobin is at baseline, albeit low, and she is at this time mostly interested in a work note and nausea medication in case she gets sick "again". Positive sick contacts most likely viral  ----------------------------------------- 9:32 PM on 05/01/2017 -----------------------------------------  Pt in nad no vomiting here abdomen benign, extensive return precautions and follow-up given and understood. Patient would prefer to go home at this time. She would like a work note  which we will provide.    ____________________________________________   FINAL CLINICAL IMPRESSION(S) / ED DIAGNOSES  Final diagnoses:  None      This chart was dictated using voice recognition software.  Despite best efforts to proofread,  errors can occur which can change meaning.      Schuyler Amor, MD 05/01/17 2036    Schuyler Amor, MD 05/01/17 2132

## 2017-05-01 NOTE — ED Notes (Signed)
Pt taking po fluids without difficulty.

## 2017-05-01 NOTE — ED Notes (Signed)
Patient discharged to home per MD order. Patient in stable condition, and deemed medically cleared by ED provider for discharge. Discharge instructions reviewed with patient/family using "Teach Back"; verbalized understanding of medication education and administration, and information about follow-up care. Denies further concerns. ° °

## 2017-05-01 NOTE — Discharge Instructions (Signed)
return to the emergency room for any new or worrisome symptoms including abdominal pain, fever or persistent vomiting any bleeding any black stool etc. Follow closely with your doctor.

## 2017-05-01 NOTE — ED Notes (Signed)
Pt in with co generalized abd pain since today with multiple episodes of of vomiting no diarrhea. Denies any dysuria, no hx of the same.

## 2018-02-25 ENCOUNTER — Encounter (HOSPITAL_COMMUNITY): Payer: Self-pay | Admitting: Emergency Medicine

## 2018-02-25 ENCOUNTER — Emergency Department (HOSPITAL_COMMUNITY)
Admission: EM | Admit: 2018-02-25 | Discharge: 2018-02-25 | Disposition: A | Discharge status: Home / Self Care | Payer: Self-pay | Attending: Emergency Medicine | Admitting: Emergency Medicine

## 2018-02-25 ENCOUNTER — Other Ambulatory Visit: Payer: Self-pay

## 2018-02-25 DIAGNOSIS — F17200 Nicotine dependence, unspecified, uncomplicated: Secondary | ICD-10-CM | POA: Insufficient documentation

## 2018-02-25 DIAGNOSIS — J45909 Unspecified asthma, uncomplicated: Secondary | ICD-10-CM | POA: Insufficient documentation

## 2018-02-25 DIAGNOSIS — J029 Acute pharyngitis, unspecified: Secondary | ICD-10-CM

## 2018-02-25 DIAGNOSIS — R0981 Nasal congestion: Secondary | ICD-10-CM

## 2018-02-25 DIAGNOSIS — Z79899 Other long term (current) drug therapy: Secondary | ICD-10-CM | POA: Insufficient documentation

## 2018-02-25 MED ORDER — ONDANSETRON 4 MG PO TBDP: 4.0000 mg | ORAL_TABLET | Freq: Three times a day (TID) | ORAL | 0 refills | Status: DC | PRN

## 2018-02-25 NOTE — ED Notes (Signed)
Patient verbalizes understanding of discharge instructions. Opportunity for questioning and answers were provided. Armband removed by staff, pt discharged from ED ambulatory.   

## 2018-02-25 NOTE — Discharge Instructions (Addendum)
Your symptoms are likely consistent with a viral illness. Viruses do not require or respond to antibiotics. Treatment is symptomatic care and it is important to note that these symptoms may last for 7-14 days.   Hand washing: Wash your hands throughout the day, but especially before and after touching the face, using the restroom, sneezing, coughing, or touching surfaces that have been coughed or sneezed upon. Hydration: Symptoms will be intensified and complicated by dehydration. Dehydration can also extend the duration of symptoms. Drink plenty of fluids and get plenty of rest. You should be drinking at least half a liter of water an hour to stay hydrated. Electrolyte drinks (ex. Gatorade, Powerade, Pedialyte) are also encouraged. You should be drinking enough fluids to make your urine light yellow, almost clear. If this is not the case, you are not drinking enough water. Please note that some of the treatments indicated below will not be effective if you are not adequately hydrated. Diet: Please concentrate on hydration, however, you may introduce food slowly.  Start with a clear liquid diet, progressed to a full liquid diet, and then bland solids as you are able. Pain or fever: Ibuprofen, Naproxen, or Tylenol for pain or fever.  Antiinflammatory medications: Take 600 mg of ibuprofen every 6 hours or 440 mg (over the counter dose) to 500 mg (prescription dose) of naproxen every 12 hours for the next 3 days. After this time, these medications may be used as needed for pain. Take these medications with food to avoid upset stomach. Choose only one of these medications, do not take them together. Tylenol: Should you continue to have additional pain while taking the ibuprofen or naproxen, you may add in tylenol as needed. Your daily total maximum amount of tylenol from all sources should be limited to 4000mg /day for persons without liver problems, or 2000mg /day for those with liver problems. Zyrtec or  Claritin: May add these medication daily to control underlying symptoms of congestion, sneezing, and other signs of allergies.  These medications are available over-the-counter. Flonase: Use this medication, as directed, for nasal and sinus congestion.  This medication is available over-the-counter. Zofran: May use the Zofran, as needed, for nausea/vomiting.  Congestion: Plain Mucinex may help relieve congestion. Saline sinus rinses and saline nasal sprays may also help relieve congestion. If you do not have heart problems or an allergy to such medications, you may also try phenylephrine or Sudafed. Sore throat: Warm liquids or Chloraseptic spray may help soothe a sore throat. Gargle twice a day with a salt water solution made from a half teaspoon of salt in a cup of warm water.  Follow up: Follow up with a primary care provider within the next two weeks should symptoms fail to resolve. Return: Return to the ED for significantly worsening symptoms, shortness of breath, persistent vomiting, large amounts of blood in stool, or any other major concerns.  For your scratches, please make sure they are cleaned daily.  Apply an antibacterial ointment, such as Neosporin, twice daily for the next 5 to 7 days.

## 2018-02-25 NOTE — ED Triage Notes (Signed)
Pt states she started having a sore throat yesterday and some nasal congestion. Pt also complains of right hand knuckles feeling tight/swollen. Denies any injury.

## 2018-02-25 NOTE — ED Provider Notes (Signed)
[facility] EMERGENCY DEPARTMENT Provider Note   CSN: [id number] Arrival date & time: 02/25/18  0908     History   Chief Complaint Chief Complaint  Patient presents with  . Sore Throat    HPI Nichole Barber is a 42 y.o. female.  HPI   Nichole Barber is a 42 y.o. female, with a history of asthma, presenting to the ED with nasal congestion and sore throat beginning yesterday. States she had one episode of vomiting yesterday, but none since then.  She has not tried any therapies for her complaints.  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  She inquires about care for them.  She has been using alcohol and hydrogen peroxide. Denies fever/chills, persistent vomiting, diarrhea, chest pain, shortness of breath, cough, abdominal pain, or any other complaints.    Past Medical History:  Diagnosis Date  . Asthma     There are no active problems to display for this patient.   Past Surgical History:  Procedure Laterality Date  . CARPAL TUNNEL RELEASE    . CESAREAN SECTION    . KNEE SURGERY       OB History   None      Home Medications    Prior to Admission medications   Medication Sig Start Date End Date Taking? Authorizing Provider  ferrous sulfate (EQL SLOW RELEASE IRON) 160 (50 Fe) MG TBCR SR tablet Take 1 tablet (160 mg total) by mouth daily. 02/22/17   Earleen Newport, MD  ibuprofen (ADVIL,MOTRIN) 800 MG tablet Take 1 tablet (800 mg total) by mouth every 8 (eight) hours as needed. Patient not taking: Reported on 02/22/2017 10/05/15   Arlyss Repress, PA-C  ondansetron (ZOFRAN ODT) 4 MG disintegrating tablet Take 1 tablet (4 mg total) by mouth every 8 (eight) hours as needed for nausea or vomiting. 02/25/18   Halo Shevlin C, PA-C  ondansetron (ZOFRAN) 4 MG tablet Take 1 tablet (4 mg total) by mouth every 8 (eight) hours as needed for nausea or vomiting. 05/01/17   Schuyler Amor, MD  predniSONE (DELTASONE)  10 MG tablet Take 10 mg by mouth daily. 02/05/17   [provider]    Family History History reviewed. No pertinent family history.  Social History Social History   Tobacco Use  . Smoking status: Current Every Day Smoker  . Smokeless tobacco: Never Used  Substance Use Topics  . Alcohol use: No  . Drug use: No     Allergies   Patient has no known allergies.   Review of Systems Review of Systems  Constitutional: Negative for chills and fever.  HENT: Positive for congestion and sore throat. Negative for trouble swallowing and voice change.   Respiratory: Negative for cough and shortness of breath.   Cardiovascular: Negative for chest pain.  Gastrointestinal: Positive for nausea (resolved) and vomiting (resolved). Negative for abdominal pain and diarrhea.  Neurological: Negative for headaches.  All other systems reviewed and are negative.    Physical Exam Updated Vital Signs BP 140/82 (BP Location: Right Arm)   Pulse 84   Temp 98.8 F (37.1 C) (Oral)   Resp 20   LMP 02/23/2018   SpO2 100%   Physical Exam  Constitutional: She appears well-developed and well-nourished. No distress.  HENT:  Head: Normocephalic and atraumatic.  Nose: Mucosal edema present. Right sinus exhibits no maxillary sinus tenderness and no frontal sinus tenderness. Left sinus exhibits no maxillary sinus tenderness and no frontal  sinus tenderness.  Mouth/Throat: Oropharynx is clear and moist.  Eyes: Conjunctivae are normal.  Neck: Neck supple.  Cardiovascular: Normal rate, regular rhythm, normal heart sounds and intact distal pulses.  Pulmonary/Chest: Effort normal and breath sounds normal. No respiratory distress.  Abdominal: Soft. There is no tenderness. There is no guarding.  Musculoskeletal: She exhibits no edema.  Lymphadenopathy:    She has no cervical adenopathy.  Neurological: She is alert.  Skin: Skin is warm and dry. She is not diaphoretic.  Psychiatric: She has a normal  mood and affect. Her behavior is normal.  Nursing note and vitals reviewed.    ED Treatments / Results  Labs (all labs ordered are listed, but only abnormal results are displayed) Labs Reviewed - No data to display  EKG None  Radiology No results found.  Procedures Procedures (including critical care time)  Medications Ordered in ED Medications - No data to display   Initial Impression / Assessment and Plan / ED Course  I have reviewed the triage vital signs and the nursing notes.  Pertinent labs & imaging results that were available during my care of the patient were reviewed by me and considered in my medical decision making (see chart for details).     Patient presents with symptoms that seem to be consistent with illness of possible viral origin. The patient was given instructions for home care as well as return precautions. Patient voices understanding of these instructions, accepts the plan, and is comfortable with discharge.  Final Clinical Impressions(s) / ED Diagnoses   Final diagnoses:  Nasal congestion  Sore throat    ED Discharge Orders        Ordered    ondansetron (ZOFRAN ODT) 4 MG disintegrating tablet  Every 8 hours PRN     02/25/18 1052       Lorayne Bender, PA-C 02/25/18 1055    Duffy Bruce, MD 02/26/18 1005

## 2018-03-25 ENCOUNTER — Ambulatory Visit (INDEPENDENT_AMBULATORY_CARE_PROVIDER_SITE_OTHER): Payer: Worker's Compensation

## 2018-03-25 ENCOUNTER — Encounter (HOSPITAL_COMMUNITY): Payer: Self-pay | Admitting: Emergency Medicine

## 2018-03-25 ENCOUNTER — Ambulatory Visit (HOSPITAL_COMMUNITY)
Admission: EM | Admit: 2018-03-25 | Discharge: 2018-03-25 | Disposition: A | Discharge status: Home / Self Care | Payer: Worker's Compensation | Attending: Family Medicine | Admitting: Family Medicine

## 2018-03-25 DIAGNOSIS — M25561 Pain in right knee: Secondary | ICD-10-CM

## 2018-03-25 IMAGING — DX DG KNEE COMPLETE 4+V*R*
4 series · 4 of 4 positions shown · non-contrast
Comparison: None.

CLINICAL DATA: Kicked in knee

EXAM:
RIGHT KNEE - COMPLETE 4+ VIEW

[knee ap]
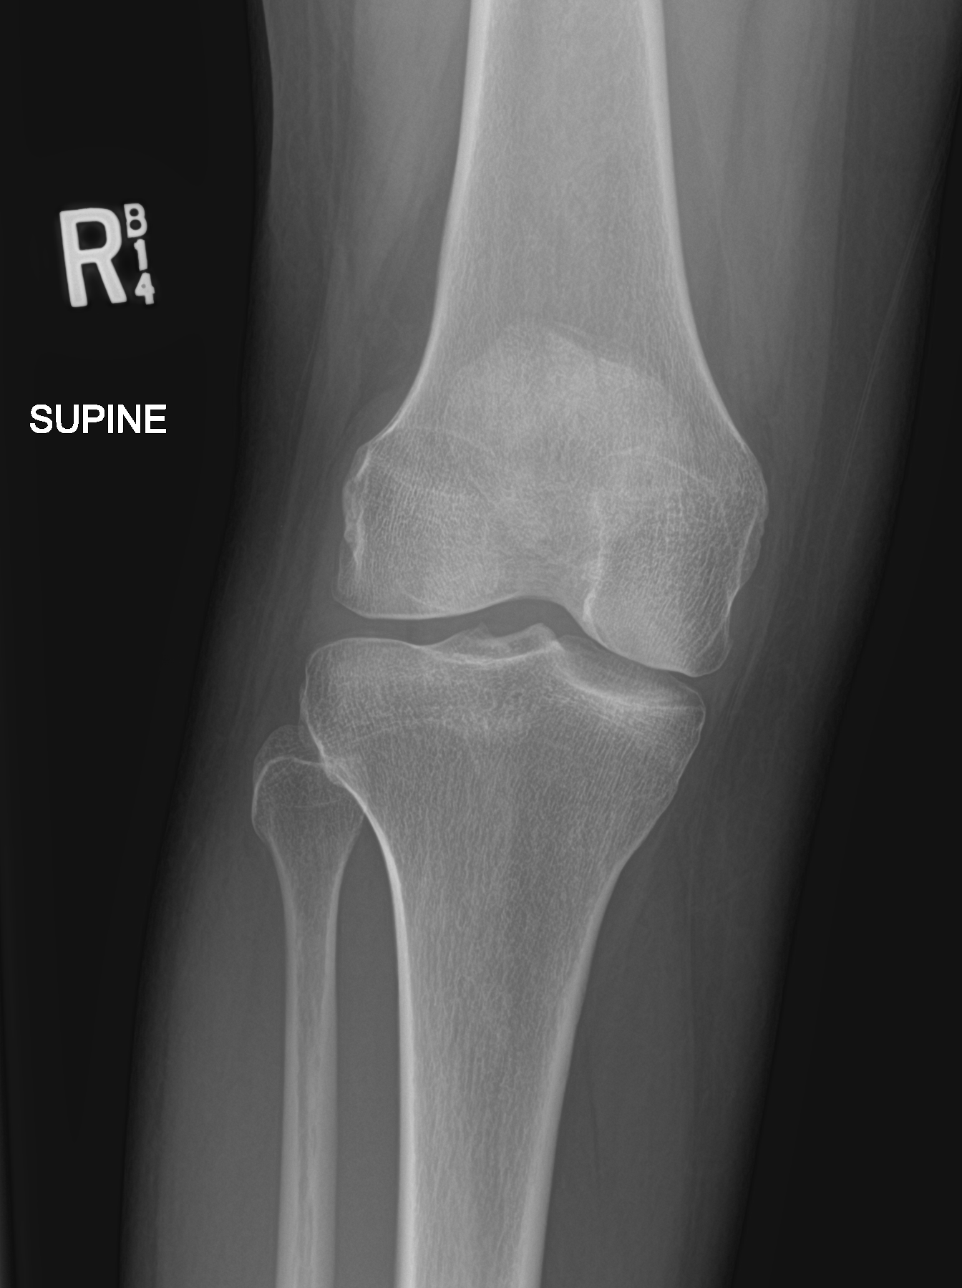

[knee obl (1 of 2)]
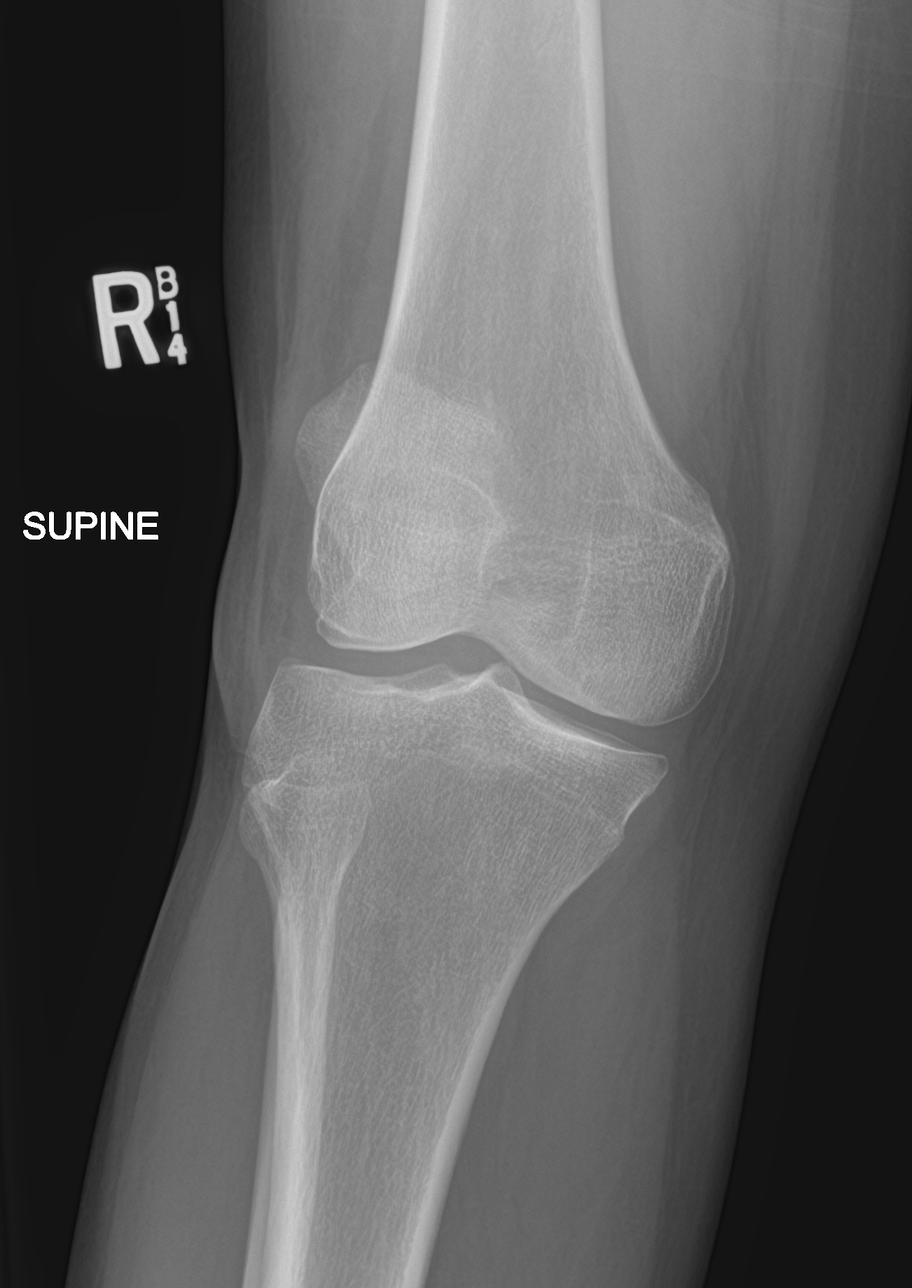

[knee obl (2 of 2)]
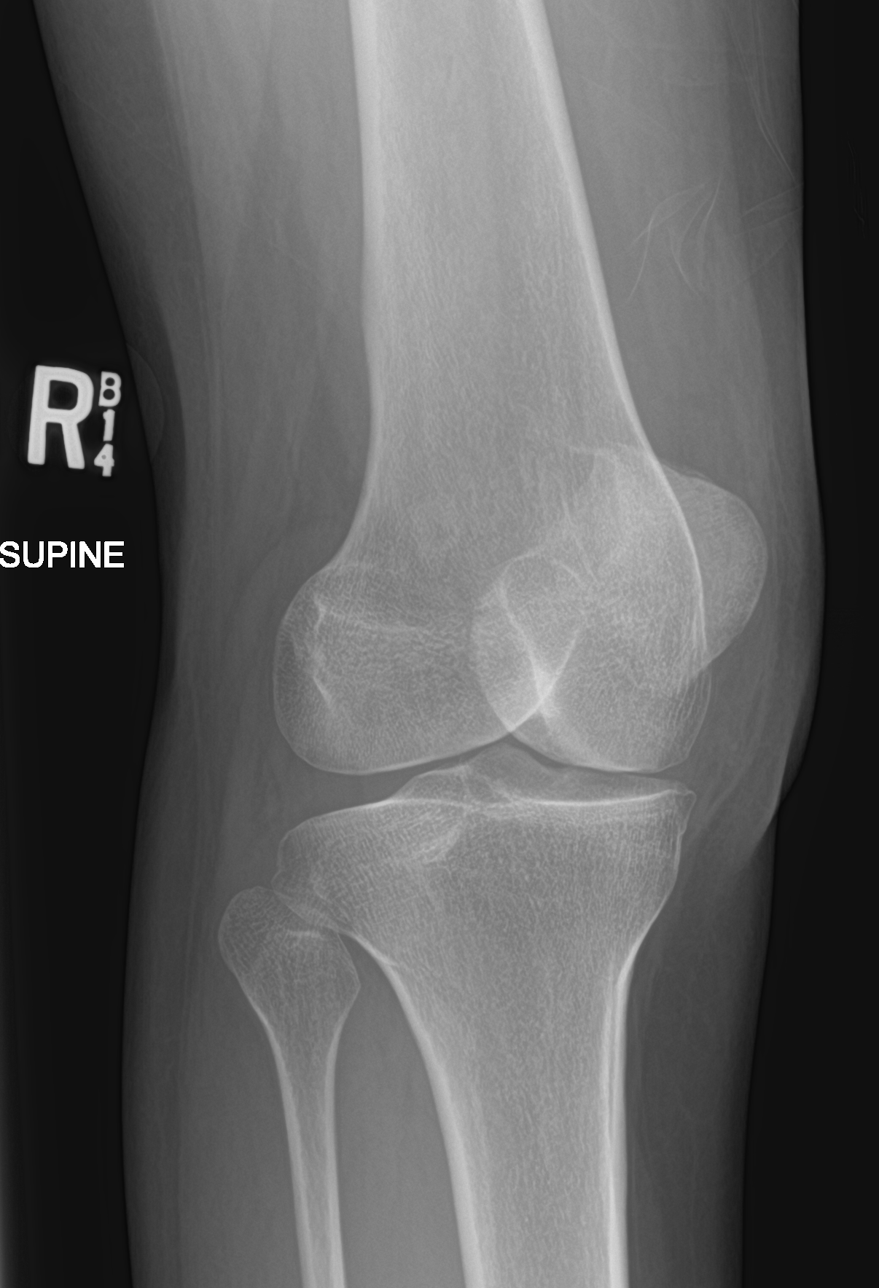

[knee lat]
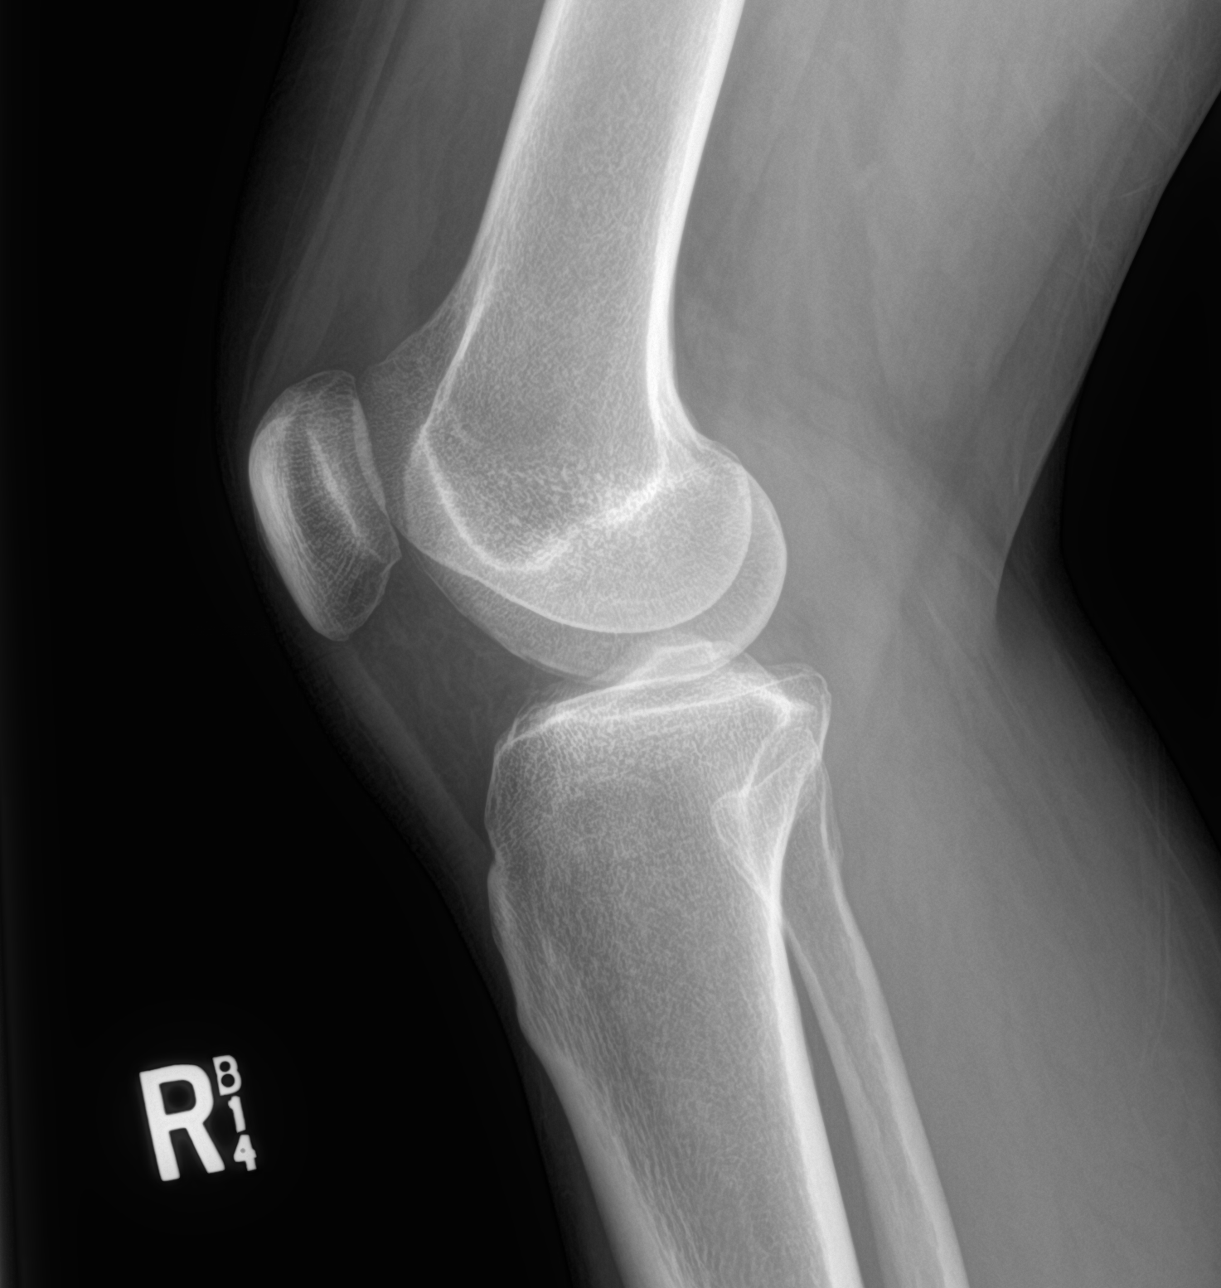

[4 of 4 positions shown; findings below may reference images not displayed]

FINDINGS: No evidence of fracture, dislocation, or joint effusion. No evidence
of arthropathy or other focal bone abnormality. Soft tissues are
unremarkable.
IMPRESSION: No fracture or dislocation of the right knee.

## 2018-03-25 MED ORDER — NAPROXEN 375 MG PO TABS: 375.0000 mg | ORAL_TABLET | Freq: Two times a day (BID) | ORAL | 0 refills | Status: DC

## 2018-03-25 NOTE — ED Notes (Signed)
Bed: UC01 Expected date:  Expected time:  Means of arrival:  Comments: For appts
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
## 2018-03-25 NOTE — Discharge Instructions (Signed)
Brace given X-rays did not show signs of fracture or dislocation Continue conservative management of rest, ice, elevation and compression Take naproxen as needed for pain relief (may cause abdominal discomfort, ulcers, and GI bleeds avoid taking with other NSAIDs) Schedule an appointment with occupational health for further evaluation and management Return or go to the ER if you have any new or worsening symptoms (fever, chills, chest pain, abdominal pain, changes in bowel or bladder habits, pain radiating into lower legs, etc...)

## 2018-05-09 DIAGNOSIS — M25561 Pain in right knee: Secondary | ICD-10-CM | POA: Insufficient documentation

## 2018-09-19 ENCOUNTER — Encounter: Payer: Self-pay | Admitting: Hematology and Oncology

## 2018-10-07 NOTE — Progress Notes (Signed)
[facility] NOTE  Patient Care Team: Center, [facility] as PCP - General (General Practice)  CHIEF COMPLAINTS/PURPOSE OF CONSULTATION: History of severe iron deficiency anemia and thrombocytosis  HISTORY OF PRESENTING ILLNESS:  Nichole Barber 43 y.o. female is here because of recent diagnosis of anemia and thrombocytosis. Lab work from 07/01/18 showed Hg 6.2, platelets 739, WBC 6.0. She presents to the clinic alone today. She reports heavy menstrual cycles lasting 6 days that have occurred for a long time but denies any other bleeding or blood in her stools. She had a tubal ligation following the birth of her youngest son in 2007. She denies diarrhea after eating gluten. She has a history of asthma and notes dyspnea on exertion but denies dizziness. She previously took oral iron when she was pregnant.   I reviewed her records extensively and collaborated the history with the patient.  MEDICAL HISTORY:  Past Medical History:  Diagnosis Date  . Asthma     SURGICAL HISTORY: Past Surgical History:  Procedure Laterality Date  . CARPAL TUNNEL RELEASE    . CESAREAN SECTION    . KNEE SURGERY      SOCIAL HISTORY: Social History   Socioeconomic History  . Marital status: Single    Spouse name: Not on file  . Number of children: Not on file  . Years of education: Not on file  . Highest education level: Not on file  Occupational History  . Not on file  Social Needs  . Financial resource strain: Not on file  . Food insecurity:    Worry: Not on file    Inability: Not on file  . Transportation needs:    Medical: Not on file    Non-medical: Not on file  Tobacco Use  . Smoking status: Current Every Day Smoker  . Smokeless tobacco: Never Used  Substance and Sexual Activity  . Alcohol use: No  . Drug use: No  . Sexual activity: Not on file  Lifestyle  . Physical activity:    Days per week: Not on file    Minutes per session: Not on file   . Stress: Not on file  Relationships  . Social connections:    Talks on phone: Not on file    Gets together: Not on file    Attends religious service: Not on file    Active member of club or organization: Not on file    Attends meetings of clubs or organizations: Not on file    Relationship status: Not on file  . Intimate partner violence:    Fear of current or ex partner: Not on file    Emotionally abused: Not on file    Physically abused: Not on file    Forced sexual activity: Not on file  Other Topics Concern  . Not on file  Social History Narrative  . Not on file    FAMILY HISTORY: No family history on file.  ALLERGIES:  has No Known Allergies.  MEDICATIONS:  Current Outpatient Medications  Medication Sig Dispense Refill  . ferrous sulfate (EQL SLOW RELEASE IRON) 160 (50 Fe) MG TBCR SR tablet Take 1 tablet (160 mg total) by mouth daily. 30 each 6  . ibuprofen (ADVIL,MOTRIN) 800 MG tablet Take 1 tablet (800 mg total) by mouth every 8 (eight) hours as needed. (Patient not taking: Reported on 02/22/2017) 30 tablet 0  . naproxen (NAPROSYN) 375 MG tablet Take 1 tablet (375 mg total) by mouth 2 (two) times  daily. 20 tablet 0  . ondansetron (ZOFRAN ODT) 4 MG disintegrating tablet Take 1 tablet (4 mg total) by mouth every 8 (eight) hours as needed for nausea or vomiting. 20 tablet 0  . ondansetron (ZOFRAN) 4 MG tablet Take 1 tablet (4 mg total) by mouth every 8 (eight) hours as needed for nausea or vomiting. 8 tablet 0  . predniSONE (DELTASONE) 10 MG tablet Take 10 mg by mouth daily.  2   No current facility-administered medications for this visit.     REVIEW OF SYSTEMS:   Constitutional: Denies fevers, chills or abnormal night sweats Eyes: Denies blurriness of vision, double vision or watery eyes Ears, nose, mouth, throat, and face: Denies mucositis or sore throat Respiratory: Denies cough or wheezes (+) dyspnea on exertion Cardiovascular: Denies palpitation, chest discomfort  or lower extremity swelling Gastrointestinal:  Denies nausea, heartburn or change in bowel habits Skin: Denies abnormal skin rashes GYN: (+) heavy menstrual periods Lymphatics: Denies new lymphadenopathy or easy bruising Neurological:Denies numbness, tingling or new weaknesses Behavioral/Psych: Mood is stable, no new changes  Breast: Denies any palpable lumps or discharge All other systems were reviewed with the patient and are negative.  PHYSICAL EXAMINATION: ECOG PERFORMANCE STATUS: 1 - Symptomatic but completely ambulatory  Vitals:   10/08/18 1536  BP: 137/74  Pulse: (!) 108  Resp: 18  Temp: 98.8 F (37.1 C)  SpO2: 100%   Filed Weights   10/08/18 1536  Weight: 154 lb (69.9 kg)    GENERAL: alert, no distress and comfortable SKIN: skin color, texture, turgor are normal, no rashes or significant lesions EYES: normal, conjunctiva are pink and non-injected, sclera clear OROPHARYNX: no exudate, no erythema and lips, buccal mucosa, and tongue normal  NECK: supple, thyroid normal size, non-tender, without nodularity LYMPH: no palpable lymphadenopathy in the cervical, axillary or inguinal LUNGS: clear to auscultation and percussion with normal breathing effort HEART: regular rate & rhythm and no murmurs and no lower extremity edema ABDOMEN: abdomen soft, non-tender and normal bowel sounds Musculoskeletal: no cyanosis of digits and no clubbing  PSYCH: alert & oriented x 3 with fluent speech NEURO: no focal motor/sensory deficits  LABORATORY DATA:  I have reviewed the data as listed Lab Results  Component Value Date   WBC 5.1 05/01/2017   HGB 6.7 (L) 05/01/2017   HCT 22.8 (L) 05/01/2017   MCV 60.4 (L) 05/01/2017   PLT 725 (H) 05/01/2017   Lab Results  Component Value Date   NA 137 05/01/2017   K 3.4 (L) 05/01/2017   CL 105 05/01/2017   CO2 25 05/01/2017    RADIOGRAPHIC STUDIES: I have personally reviewed the radiological reports and agreed with the findings in the  report.  ASSESSMENT AND PLAN:  Hypochromic microcytic anemia 07/01/2018: Hemoglobin 6.2, MCV 63.3, RDW 17.4, platelets 753 05/01/2017: Hemoglobin 6.7, MCV 60.4, RDW 20.4, platelets 725 02/22/2017: Hemoglobin 6.8, MCV 57.6, RDW 20, platelets 889  Iron studies 02/22/2017: 2% iron saturation  Iron deficiency anemia: I discussed with the patient the process of iron absorption. I counseled extensively regarding the different causes of iron deficiency including blood loss and malabsorption.  Recommendation: Proceed with 2 doses of IV iron infusion with Feraheme Recheck CBC and iron studies in 3 months and follow-up after that. Gyn evaluation for blood loss: I sent referral to GYN.  I discussed with the patient that potentially there may be a need for additional IV iron infusions if the iron levels were to remain low in the future. The frequency  of need of IV iron would depend on many other factors including the rate of loss.  Return to clinic in 3 months with recheck on iron studies and hemoglobin.  All questions were answered. The patient knows to call the clinic with any problems, questions or concerns.   Nicholas Lose, MD  10/08/2018   I, Cloyde Reams Dorshimer, am acting as scribe for Nicholas Lose, MD.  I have reviewed the above documentation for accuracy and completeness, and I agree with the above.

## 2018-10-08 ENCOUNTER — Inpatient Hospital Stay: Payer: Medicaid Other | Attending: Hematology and Oncology | Admitting: Hematology and Oncology

## 2018-10-08 ENCOUNTER — Inpatient Hospital Stay: Payer: Medicaid Other

## 2018-10-08 ENCOUNTER — Other Ambulatory Visit: Payer: Self-pay

## 2018-10-08 DIAGNOSIS — D509 Iron deficiency anemia, unspecified: Secondary | ICD-10-CM

## 2018-10-08 DIAGNOSIS — Z79899 Other long term (current) drug therapy: Secondary | ICD-10-CM | POA: Insufficient documentation

## 2018-10-08 DIAGNOSIS — F17210 Nicotine dependence, cigarettes, uncomplicated: Secondary | ICD-10-CM | POA: Insufficient documentation

## 2018-10-08 DIAGNOSIS — J45909 Unspecified asthma, uncomplicated: Secondary | ICD-10-CM | POA: Insufficient documentation

## 2018-10-08 DIAGNOSIS — R0602 Shortness of breath: Secondary | ICD-10-CM | POA: Insufficient documentation

## 2018-10-08 DIAGNOSIS — T454X5A Adverse effect of iron and its compounds, initial encounter: Secondary | ICD-10-CM | POA: Diagnosis not present

## 2018-10-08 DIAGNOSIS — R7989 Other specified abnormal findings of blood chemistry: Secondary | ICD-10-CM | POA: Diagnosis not present

## 2018-10-08 LAB — CBC WITH DIFFERENTIAL (CANCER CENTER ONLY)
Abs Immature Granulocytes: 0.02 10*3/uL (ref 0.00–0.07)
BASOS ABS: 0 10*3/uL (ref 0.0–0.1)
Basophils Relative: 0 %
EOS ABS: 0.4 10*3/uL (ref 0.0–0.5)
EOS PCT: 5 %
HCT: 24.8 % — ABNORMAL LOW (ref 36.0–46.0)
Hemoglobin: 6.2 g/dL — CL (ref 12.0–15.0)
IMMATURE GRANULOCYTES: 0 %
LYMPHS PCT: 24 %
Lymphs Abs: 1.9 10*3/uL (ref 0.7–4.0)
MCH: 16.5 pg — ABNORMAL LOW (ref 26.0–34.0)
MCHC: 25 g/dL — AB (ref 30.0–36.0)
MCV: 66 fL — ABNORMAL LOW (ref 80.0–100.0)
Monocytes Absolute: 0.7 10*3/uL (ref 0.1–1.0)
Monocytes Relative: 8 %
NEUTROS PCT: 63 %
NRBC: 0 % (ref 0.0–0.2)
Neutro Abs: 5.1 10*3/uL (ref 1.7–7.7)
PLATELETS: 954 10*3/uL — AB (ref 150–400)
RBC: 3.76 MIL/uL — AB (ref 3.87–5.11)
RDW: 20.1 % — AB (ref 11.5–15.5)
WBC: 8.1 10*3/uL (ref 4.0–10.5)

## 2018-10-08 NOTE — Assessment & Plan Note (Signed)
07/01/2018: Hemoglobin 6.2, MCV 63.3, RDW 17.4, platelets 753 05/01/2017: Hemoglobin 6.7, MCV 60.4, RDW 20.4, platelets 725 02/22/2017: Hemoglobin 6.8, MCV 57.6, RDW 20, platelets 889  Iron studies 02/22/2017: 2% iron saturation  Iron deficiency anemia: I discussed with the patient the process of iron absorption. I counseled extensively regarding the different causes of iron deficiency including blood loss and malabsorption.  Recommendation: Proceed with 2 doses of IV iron infusion with Feraheme Recheck CBC and iron studies in 1 month and follow-up after that. GI and GU evaluation for blood loss  I discussed with the patient that potentially there may be a need for additional IV iron infusions if the iron levels were to remain low in the future. The frequency of need of IV iron would depend on many other factors including the rate of loss and by the degree of absorption.  Return to clinic in 1 month with recheck on iron studies and hemoglobin.

## 2018-10-09 LAB — FERRITIN

## 2018-10-09 LAB — IRON AND TIBC
IRON: 9 ug/dL — AB (ref 41–142)
Saturation Ratios: 2 % — ABNORMAL LOW (ref 21–57)
TIBC: 416 ug/dL (ref 236–444)
UIBC: 407 ug/dL — ABNORMAL HIGH (ref 120–384)

## 2018-10-13 ENCOUNTER — Inpatient Hospital Stay: Payer: Medicaid Other

## 2018-10-13 ENCOUNTER — Other Ambulatory Visit: Payer: Self-pay | Admitting: Hematology and Oncology

## 2018-10-13 ENCOUNTER — Inpatient Hospital Stay (HOSPITAL_BASED_OUTPATIENT_CLINIC_OR_DEPARTMENT_OTHER): Payer: Medicaid Other | Admitting: Medical

## 2018-10-13 ENCOUNTER — Other Ambulatory Visit: Payer: Self-pay

## 2018-10-13 VITALS — BP 142/76 | HR 88 | Temp 98.1°F | Resp 20

## 2018-10-13 DIAGNOSIS — T454X5A Adverse effect of iron and its compounds, initial encounter: Secondary | ICD-10-CM

## 2018-10-13 DIAGNOSIS — R0609 Other forms of dyspnea: Secondary | ICD-10-CM | POA: Diagnosis not present

## 2018-10-13 DIAGNOSIS — T8090XA Unspecified complication following infusion and therapeutic injection, initial encounter: Secondary | ICD-10-CM

## 2018-10-13 DIAGNOSIS — D509 Iron deficiency anemia, unspecified: Secondary | ICD-10-CM

## 2018-10-13 MED ORDER — SODIUM CHLORIDE 0.9 % IV SOLN
Freq: Once | INTRAVENOUS | Status: AC
  Administered 2018-10-13: 10:00:00 via INTRAVENOUS
  Filled 2018-10-13: qty 250

## 2018-10-13 MED ORDER — FAMOTIDINE IN NACL 20-0.9 MG/50ML-% IV SOLN
20.0000 mg | Freq: Once | INTRAVENOUS | Status: DC
  Administered 2018-10-13: 20 mg via INTRAVENOUS

## 2018-10-13 MED ORDER — SODIUM CHLORIDE 0.9 % IV SOLN
510.0000 mg | Freq: Once | INTRAVENOUS | Status: AC
  Administered 2018-10-13: 510 mg via INTRAVENOUS
  Filled 2018-10-13: qty 17

## 2018-10-13 MED ORDER — SODIUM CHLORIDE 0.9 % IV SOLN
20.0000 mg | Freq: Once | INTRAVENOUS | Status: DC
  Filled 2018-10-13: qty 2

## 2018-10-13 NOTE — Patient Instructions (Signed)

## 2018-10-13 NOTE — Progress Notes (Signed)
At 1019 Pt c/o of mild "shortness of breath". Feraheme stopped. NSS started. Vital signs obtained. O2 sat 100% vss. Sandi Mealy PA-C notified.  [place] PA-C at chairside to evaluate patient. Orders received and implemented.  1023 Pt no longer short of breath. Pepcid infusing

## 2018-10-15 NOTE — Progress Notes (Signed)
    DATE:  10/13/2018                                         X CHEMO/IMMUNOTHERAPY REACTION             MD:  Dr. Nicholas Lose   AGENT/BLOOD PRODUCT RECEIVING TODAY:               Feraheme   AGENT/BLOOD PRODUCT RECEIVING IMMEDIATELY PRIOR TO REACTION:           Feraheme   VS: BP:      124/79   P:        86       SPO2:        100% on room air                  REACTION(S):          Shortness of breath   PREMEDS:       None   INTERVENTION: Pepcid 20 mg IV x1   Review of Systems  Review of Systems  Constitutional: Negative for chills, diaphoresis and fever.  HENT: Negative for trouble swallowing and voice change.   Respiratory: Positive for shortness of breath. Negative for cough, choking, chest tightness, wheezing and stridor.   Cardiovascular: Negative for chest pain and palpitations.  Gastrointestinal: Negative for abdominal pain, constipation, diarrhea, nausea and vomiting.  Musculoskeletal: Negative for back pain and myalgias.  Neurological: Negative for dizziness, light-headedness and headaches.     Physical Exam  Physical Exam Constitutional:      General: She is not in acute distress.    Appearance: She is not diaphoretic.  HENT:     Head: Normocephalic and atraumatic.  Cardiovascular:     Rate and Rhythm: Normal rate and regular rhythm.     Heart sounds: Normal heart sounds. No murmur. No friction rub. No gallop.   Pulmonary:     Effort: Pulmonary effort is normal. No respiratory distress.     Breath sounds: Normal breath sounds. No wheezing or rales.  Skin:    General: Skin is warm and dry.     Findings: No erythema or rash.  Neurological:     Mental Status: She is alert.     OUTCOME:                 The patient shortness of breath abated once she received Pepcid.  She was able to complete her IV Feraheme infusion without any further issues of concern.   Sandi Mealy, MHS, PA-C

## 2018-10-20 ENCOUNTER — Inpatient Hospital Stay: Payer: Medicaid Other

## 2018-10-20 ENCOUNTER — Other Ambulatory Visit: Payer: Self-pay

## 2018-10-20 VITALS — BP 127/73 | HR 83 | Temp 97.7°F | Resp 16

## 2018-10-20 DIAGNOSIS — D509 Iron deficiency anemia, unspecified: Secondary | ICD-10-CM | POA: Diagnosis not present

## 2018-10-20 MED ORDER — DIPHENHYDRAMINE HCL 50 MG/ML IJ SOLN
25.0000 mg | Freq: Once | INTRAMUSCULAR | Status: AC
  Administered 2018-10-20: 25 mg via INTRAVENOUS

## 2018-10-20 MED ORDER — SODIUM CHLORIDE 0.9 % IV SOLN
Freq: Once | INTRAVENOUS | Status: AC
  Administered 2018-10-20: 09:00:00 via INTRAVENOUS
  Filled 2018-10-20: qty 250

## 2018-10-20 MED ORDER — SODIUM CHLORIDE 0.9 % IV SOLN
20.0000 mg | Freq: Once | INTRAVENOUS | Status: AC
  Administered 2018-10-20: 20 mg via INTRAVENOUS
  Filled 2018-10-20: qty 2

## 2018-10-20 MED ORDER — SODIUM CHLORIDE 0.9 % IV SOLN
510.0000 mg | Freq: Once | INTRAVENOUS | Status: AC
  Administered 2018-10-20: 510 mg via INTRAVENOUS
  Filled 2018-10-20: qty 17

## 2018-10-20 MED ORDER — DIPHENHYDRAMINE HCL 50 MG/ML IJ SOLN
INTRAMUSCULAR | Status: AC
  Filled 2018-10-20: qty 1

## 2018-10-20 NOTE — Patient Instructions (Addendum)
Ferumoxytol injection What is this medicine? FERUMOXYTOL is an iron complex. Iron is used to make healthy red blood cells, which carry oxygen and nutrients throughout the body. This medicine is used to treat iron deficiency anemia. This medicine may be used for other purposes; ask your health care provider or pharmacist if you have questions. COMMON BRAND NAME(S): Feraheme What should I tell my health care provider before I take this medicine? They need to know if you have any of these conditions: -anemia not caused by low iron levels -high levels of iron in the blood -magnetic resonance imaging (MRI) test scheduled -an unusual or allergic reaction to iron, other medicines, foods, dyes, or preservatives -pregnant or trying to get pregnant -breast-feeding How should I use this medicine? This medicine is for injection into a vein. It is given by a health care professional in a hospital or clinic setting. Talk to your pediatrician regarding the use of this medicine in children. Special care may be needed. Overdosage: If you think you have taken too much of this medicine contact a poison control center or emergency room at once. NOTE: This medicine is only for you. Do not share this medicine with others. What if I miss a dose? It is important not to miss your dose. Call your doctor or health care professional if you are unable to keep an appointment. What may interact with this medicine? This medicine may interact with the following medications: -other iron products This list may not describe all possible interactions. Give your health care provider a list of all the medicines, herbs, non-prescription drugs, or dietary supplements you use. Also tell them if you smoke, drink alcohol, or use illegal drugs. Some items may interact with your medicine. What should I watch for while using this medicine? Visit your doctor or healthcare professional regularly. Tell your doctor or healthcare professional  if your symptoms do not start to get better or if they get worse. You may need blood work done while you are taking this medicine. You may need to follow a special diet. Talk to your doctor. Foods that contain iron include: whole grains/cereals, dried fruits, beans, or peas, leafy green vegetables, and organ meats (liver, kidney). What side effects may I notice from receiving this medicine? Side effects that you should report to your doctor or health care professional as soon as possible: -allergic reactions like skin rash, itching or hives, swelling of the face, lips, or tongue -breathing problems -changes in blood pressure -feeling faint or lightheaded, falls -fever or chills -flushing, sweating, or hot feelings -swelling of the ankles or feet Side effects that usually do not require medical attention (report to your doctor or health care professional if they continue or are bothersome): -diarrhea -headache -nausea, vomiting -stomach pain This list may not describe all possible side effects. Call your doctor for medical advice about side effects. You may report side effects to FDA at 1-800-FDA-1088. Where should I keep my medicine? This drug is given in a hospital or clinic and will not be stored at home. NOTE: This sheet is a summary. It may not cover all possible information. If you have questions about this medicine, talk to your doctor, pharmacist, or health care provider.  2019 Elsevier/Gold Standard (2016-09-03 20:21:10)  Coronavirus (COVID-19) Are you at risk?  Are you at risk for the Coronavirus (COVID-19)?  To be considered HIGH RISK for Coronavirus (COVID-19), you have to meet the following criteria:  . Traveled to [place], [place], [place], [place] or   [place]; or in the [place] to [place], [place], [place], or [place]; and have fever, cough, and shortness of breath within the last 2 weeks of travel OR . Been in close contact with a person diagnosed with COVID-19  within the last 2 weeks and have fever, cough, and shortness of breath . IF YOU DO NOT MEET THESE CRITERIA, YOU ARE CONSIDERED LOW RISK FOR COVID-19.  What to do if you are HIGH RISK for COVID-19?  . If you are having a medical emergency, call 911. . Seek medical care right away. Before you go to a doctor's office, urgent care or emergency department, call ahead and tell them about your recent travel, contact with someone diagnosed with COVID-19, and your symptoms. You should receive instructions from your physician's office regarding next steps of care.  . When you arrive at healthcare provider, tell the healthcare staff immediately you have returned from visiting [place], [place], [place], [place] or [place]; or traveled in the [place] to [place], [place], [place], or [place]; in the last two weeks or you have been in close contact with a person diagnosed with COVID-19 in the last 2 weeks.   . Tell the health care staff about your symptoms: fever, cough and shortness of breath. . After you have been seen by a medical provider, you will be either: o Tested for (COVID-19) and discharged home on quarantine except to seek medical care if symptoms worsen, and asked to  - Stay home and avoid contact with others until you get your results (4-5 days)  - Avoid travel on public transportation if possible (such as bus, train, or airplane) or o Sent to the Emergency Department by EMS for evaluation, COVID-19 testing, and possible admission depending on your condition and test results.  What to do if you are LOW RISK for COVID-19?  Reduce your risk of any infection by using the same precautions used for avoiding the common cold or flu:  . Wash your hands often with soap and warm water for at least 20 seconds.  If soap and water are not readily available, use an alcohol-based hand sanitizer with at least 60% alcohol.  . If coughing or sneezing, cover your mouth and nose by coughing or sneezing  into the elbow areas of your shirt or coat, into a tissue or into your sleeve (not your hands). . Avoid shaking hands with others and consider head nods or verbal greetings only. . Avoid touching your eyes, nose, or mouth with unwashed hands.  . Avoid close contact with people who are sick. . Avoid places or events with large numbers of people in one location, like concerts or sporting events. . Carefully consider travel plans you have or are making. . If you are planning any travel outside or inside the [place], visit the CDC's Travelers' Health webpage for the latest health notices. . If you have some symptoms but not all symptoms, continue to monitor at home and seek medical attention if your symptoms worsen. . If you are having a medical emergency, call 911.   ADDITIONAL HEALTHCARE OPTIONS FOR PATIENTS  [facility] / e-Visit: [email or web address]         [facility]: [phone]  [facility]: [phone]                   [facility]: [phone]   

## 2018-10-20 NOTE — Progress Notes (Signed)
Per Van's note, patient experienced shortness of breath w/ first feraheme infusion, resolved w/ Pepcid. Pepcid and Benadryl added to today's treatment per MD.  Demetrius Charity, PharmD, [facility] Pharmacist Pharmacy Phone: [phone] 10/20/2018

## 2019-01-08 ENCOUNTER — Inpatient Hospital Stay: Payer: Medicaid Other | Attending: Hematology and Oncology

## 2019-01-09 NOTE — Assessment & Plan Note (Deleted)
07/01/2018: Hemoglobin 6.2, MCV 63.3, RDW 17.4, platelets 753 05/01/2017: Hemoglobin 6.7, MCV 60.4, RDW 20.4, platelets 725 02/22/2017: Hemoglobin 6.8, MCV 57.6, RDW 20, platelets 889, iron saturation 2%  Prior treatment: IV iron March 2020 Lab review: Gyn evaluation for blood loss: I sent referral to GYN.

## 2019-01-15 ENCOUNTER — Inpatient Hospital Stay: Payer: Medicaid Other | Admitting: Hematology and Oncology

## 2019-09-09 ENCOUNTER — Telehealth: Payer: Self-pay | Admitting: Hematology and Oncology

## 2019-09-09 ENCOUNTER — Other Ambulatory Visit: Payer: Self-pay | Admitting: *Deleted

## 2019-09-09 DIAGNOSIS — D509 Iron deficiency anemia, unspecified: Secondary | ICD-10-CM

## 2019-09-09 NOTE — Telephone Encounter (Signed)
I talk with patient regarding schedule  

## 2019-09-10 ENCOUNTER — Other Ambulatory Visit (HOSPITAL_COMMUNITY): Payer: Self-pay | Admitting: Primary Care

## 2019-09-10 ENCOUNTER — Other Ambulatory Visit: Payer: Self-pay | Admitting: Primary Care

## 2019-09-10 DIAGNOSIS — N920 Excessive and frequent menstruation with regular cycle: Secondary | ICD-10-CM

## 2019-09-16 ENCOUNTER — Encounter (HOSPITAL_COMMUNITY): Payer: Self-pay

## 2019-09-16 ENCOUNTER — Ambulatory Visit (HOSPITAL_COMMUNITY): Payer: Medicaid Other

## 2019-09-22 ENCOUNTER — Other Ambulatory Visit: Payer: Self-pay | Admitting: Hematology and Oncology

## 2019-09-22 ENCOUNTER — Other Ambulatory Visit: Payer: Self-pay

## 2019-09-22 ENCOUNTER — Inpatient Hospital Stay: Payer: Medicaid Other | Attending: Hematology and Oncology

## 2019-09-22 ENCOUNTER — Encounter: Payer: Self-pay | Admitting: *Deleted

## 2019-09-22 DIAGNOSIS — Z79899 Other long term (current) drug therapy: Secondary | ICD-10-CM | POA: Insufficient documentation

## 2019-09-22 DIAGNOSIS — Z880 Allergy status to penicillin: Secondary | ICD-10-CM | POA: Insufficient documentation

## 2019-09-22 DIAGNOSIS — Z881 Allergy status to other antibiotic agents status: Secondary | ICD-10-CM | POA: Insufficient documentation

## 2019-09-22 DIAGNOSIS — R7989 Other specified abnormal findings of blood chemistry: Secondary | ICD-10-CM | POA: Insufficient documentation

## 2019-09-22 DIAGNOSIS — Z7952 Long term (current) use of systemic steroids: Secondary | ICD-10-CM | POA: Insufficient documentation

## 2019-09-22 DIAGNOSIS — D509 Iron deficiency anemia, unspecified: Secondary | ICD-10-CM

## 2019-09-22 LAB — IRON AND TIBC
Iron: 9 ug/dL — ABNORMAL LOW (ref 41–142)
Saturation Ratios: 2 % — ABNORMAL LOW (ref 21–57)
TIBC: 394 ug/dL (ref 236–444)
UIBC: 385 ug/dL — ABNORMAL HIGH (ref 120–384)

## 2019-09-22 LAB — CBC WITH DIFFERENTIAL (CANCER CENTER ONLY)
Abs Immature Granulocytes: 0.01 10*3/uL (ref 0.00–0.07)
Basophils Absolute: 0 10*3/uL (ref 0.0–0.1)
Basophils Relative: 1 %
Eosinophils Absolute: 0.3 10*3/uL (ref 0.0–0.5)
Eosinophils Relative: 5 %
HCT: 26.6 % — ABNORMAL LOW (ref 36.0–46.0)
Hemoglobin: 6.8 g/dL — CL (ref 12.0–15.0)
Immature Granulocytes: 0 %
Lymphocytes Relative: 36 %
Lymphs Abs: 2 10*3/uL (ref 0.7–4.0)
MCH: 16.8 pg — ABNORMAL LOW (ref 26.0–34.0)
MCHC: 25.6 g/dL — ABNORMAL LOW (ref 30.0–36.0)
MCV: 65.7 fL — ABNORMAL LOW (ref 80.0–100.0)
Monocytes Absolute: 0.8 10*3/uL (ref 0.1–1.0)
Monocytes Relative: 13 %
Neutro Abs: 2.6 10*3/uL (ref 1.7–7.7)
Neutrophils Relative %: 45 %
Platelet Count: 839 10*3/uL — ABNORMAL HIGH (ref 150–400)
RBC: 4.05 MIL/uL (ref 3.87–5.11)
RDW: 21.3 % — ABNORMAL HIGH (ref 11.5–15.5)
WBC Count: 5.7 10*3/uL (ref 4.0–10.5)
nRBC: 0 % (ref 0.0–0.2)

## 2019-09-22 LAB — FERRITIN: Ferritin: <4 ng/mL — ABNORMAL LOW (ref 11–307)

## 2019-09-22 NOTE — Progress Notes (Signed)
CRITICAL VALUE ALERT  Critical Value:  Hgb 6.8  Date & Time Notied:  09/22/19 at 1145  Provider Notified: Nicholas Lose, MD  Orders Received/Actions taken: MD aware

## 2019-09-22 NOTE — Patient Instructions (Signed)
Received call from lab with critical value of Hgb 6.8 on patient.  Called desk RN and she spoke with Dr. Lindi Adie and he stated she is usually this level due to her chronic anemia.  She will get her scheduled for a possible iron infusion after her visit that is scheduled on Thursday.  Gardiner Rhyme, RN

## 2019-09-23 ENCOUNTER — Telehealth: Payer: Self-pay | Admitting: Hematology and Oncology

## 2019-09-23 NOTE — Progress Notes (Signed)
12  Patient Care Team: Center, [facility] as PCP - General (General Practice)  DIAGNOSIS:    ICD-10-CM   1. 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 with above-mentioned history of severe iron deficiency anemia previously treated with IV iron and thrombocytosis. Lab work from 09/23/19 showed Hg 6.8, HCT 26.6, MCV 65.7, platelets 839, iron saturation 2%, ferritin <4. She presents to the clinic alone today.  In spite of such a low hemoglobin she feels pretty good.  She tells me that she has heavy menstrual cycles and last year when we referred her to gynecology COVID-19 hit and then she did not make another appointment.  She has appointments for gynecologist coming up.  ALLERGIES:  is allergic to amoxicillin-pot clavulanate; metronidazole; and pineapple.  MEDICATIONS:  Current Outpatient Medications  Medication Sig Dispense Refill  . albuterol (VENTOLIN HFA) 108 (90 Base) MCG/ACT inhaler Frequency:PHARMDIR   Dosage:90   MCG  Instructions:  Note:used inhaler as needed daily for asthma attacks Dose: 90 MCG    . beclomethasone (QVAR) 40 MCG/ACT inhaler Frequency:PHARMDIR   Dosage:40   MCG  Instructions:  Note:use q var inhaler twice daily; if dose is lower than home dose please provide 80 mcg inhaler Dose: 40 MCG    . Cetirizine HCl 10 MG CAPS 10 mg.    . ferrous sulfate (EQL SLOW RELEASE IRON) 160 (50 Fe) MG TBCR SR tablet Take 1 tablet (160 mg total) by mouth daily. 30 each 6  . ferrous sulfate 325 (65 FE) MG tablet Take by mouth.    . Fluocinolone Acetonide Body 0.01 % OIL 0.01 %.    . fluticasone (FLONASE) 50 MCG/ACT nasal spray FLUTICASONE PROPIONATE 50 MCG/ACT SUSP    . ibuprofen (ADVIL,MOTRIN) 800 MG tablet Take 1 tablet (800 mg total) by mouth every 8 (eight) hours as needed. (Patient not taking: Reported on 02/22/2017) 30 tablet 0  .  montelukast (SINGULAIR) 10 MG tablet 10 mg.    . mupirocin cream (BACTROBAN) 2 % 2 %.    . naproxen (NAPROSYN) 375 MG tablet Take 1 tablet (375 mg total) by mouth 2 (two) times daily. 20 tablet 0  . omeprazole (PRILOSEC) 40 MG capsule 40 mg.    . ondansetron (ZOFRAN ODT) 4 MG disintegrating tablet Take 1 tablet (4 mg total) by mouth every 8 (eight) hours as needed for nausea or vomiting. 20 tablet 0  . ondansetron (ZOFRAN) 4 MG tablet Take 1 tablet (4 mg total) by mouth every 8 (eight) hours as needed for nausea or vomiting. 8 tablet 0  . predniSONE (DELTASONE) 10 MG tablet Take 10 mg by mouth daily.  2  . pregabalin (LYRICA) 50 MG capsule Take by mouth.    . Respiratory Therapy Supplies (PARI BABY CONVERSION KIT) MISC NEBULIZER    . SUMAtriptan (IMITREX) 25 MG tablet 25 mg.     No current facility-administered medications for this visit.    PHYSICAL EXAMINATION: ECOG PERFORMANCE STATUS: 1 - Symptomatic but completely ambulatory  There were no vitals filed for this visit. There were no vitals filed for this visit.  LABORATORY DATA:  I have reviewed the data as listed CMP Latest Ref Rng & Units 05/01/2017 02/22/2017  Glucose 65 - 99 mg/dL 118(H) 94  BUN 6 - 20 mg/dL 9 8  Creatinine 0.44 - 1.00 mg/dL 0.69 0.65  Sodium 135 - 145 mmol/L  137 138  Potassium 3.5 - 5.1 mmol/L 3.4(L) 3.6  Chloride 101 - 111 mmol/L 105 106  CO2 22 - 32 mmol/L 25 26  Calcium 8.9 - 10.3 mg/dL 8.7(L) 8.6(L)  Total Protein 6.5 - 8.1 g/dL 7.7 7.6  Total Bilirubin 0.3 - 1.2 mg/dL 0.3 0.2(L)  Alkaline Phos 38 - 126 U/L 71 84  AST 15 - 41 U/L 19 21  ALT 14 - 54 U/L 9(L) 11(L)    Lab Results  Component Value Date   WBC 5.7 09/22/2019   HGB 6.8 (LL) 09/22/2019   HCT 26.6 (L) 09/22/2019   MCV 65.7 (L) 09/22/2019   PLT 839 (H) 09/22/2019   NEUTROABS 2.6 09/22/2019    ASSESSMENT & PLAN:  Hypochromic microcytic anemia 09/24/2019: Hemoglobin 6.8, MCV 65.7, platelets 339, ferritin less than 4, TIBC 394, iron  saturation 2% Prior treatment: Iron infusion: March 2020 (well ferritin was less than 4 and hemoglobin was 6.2) After the last infusion patient did not come immediately for follow-up  Treatment plan: Recommend Venofer starting today. Patient needs GYN evaluation.  She has an appointment with gynecology coming up.  She understands risks and benefits of IV iron and is willing to proceed. She will need every 59-monthiron levels checkups. Thrombocytosis: Secondary to iron deficiency and does not need any active management other than fixing the iron deficiency problem.  Return to clinic in 3 months.  No orders of the defined types were placed in this encounter.  The patient has a good understanding of the overall plan. she agrees with it. she will call with any problems that may develop before the next visit here.  Total time spent: 30 mins including face to face time and time spent for planning, charting and coordination of care  GNicholas Lose MD 09/24/2019  I, MCloyde ReamsDorshimer, am acting as scribe for Dr. VNicholas Lose  I have reviewed the above documentation for accuracy and completeness, and I agree with the above.

## 2019-09-23 NOTE — Telephone Encounter (Signed)
I talk with patient

## 2019-09-24 ENCOUNTER — Inpatient Hospital Stay: Payer: Medicaid Other | Admitting: Hematology and Oncology

## 2019-09-24 ENCOUNTER — Telehealth: Payer: Self-pay | Admitting: Hematology and Oncology

## 2019-09-24 ENCOUNTER — Other Ambulatory Visit: Payer: Self-pay

## 2019-09-24 ENCOUNTER — Inpatient Hospital Stay (HOSPITAL_BASED_OUTPATIENT_CLINIC_OR_DEPARTMENT_OTHER): Payer: Medicaid Other | Admitting: Hematology and Oncology

## 2019-09-24 ENCOUNTER — Inpatient Hospital Stay: Payer: Medicaid Other

## 2019-09-24 DIAGNOSIS — D509 Iron deficiency anemia, unspecified: Secondary | ICD-10-CM | POA: Diagnosis not present

## 2019-09-24 MED ORDER — SODIUM CHLORIDE 0.9 % IV SOLN
Freq: Once | INTRAVENOUS | Status: AC
  Filled 2019-09-24: qty 250

## 2019-09-24 MED ORDER — FAMOTIDINE IN NACL 20-0.9 MG/50ML-% IV SOLN
INTRAVENOUS | Status: AC
  Filled 2019-09-24: qty 50

## 2019-09-24 MED ORDER — DIPHENHYDRAMINE HCL 50 MG/ML IJ SOLN
INTRAMUSCULAR | Status: AC
  Filled 2019-09-24: qty 1

## 2019-09-24 MED ORDER — DIPHENHYDRAMINE HCL 50 MG/ML IJ SOLN
25.0000 mg | Freq: Once | INTRAMUSCULAR | Status: AC
  Administered 2019-09-24: 25 mg via INTRAVENOUS

## 2019-09-24 MED ORDER — SODIUM CHLORIDE 0.9 % IV SOLN
200.0000 mg | Freq: Once | INTRAVENOUS | Status: AC
  Administered 2019-09-24: 200 mg via INTRAVENOUS
  Filled 2019-09-24: qty 200

## 2019-09-24 MED ORDER — FAMOTIDINE IN NACL 20-0.9 MG/50ML-% IV SOLN
20.0000 mg | Freq: Once | INTRAVENOUS | Status: AC
  Administered 2019-09-24: 20 mg via INTRAVENOUS

## 2019-09-24 NOTE — Progress Notes (Signed)
Pt tolerated iron infusion without complaint. She did receive Benedryl 25IV and pepcid today.

## 2019-09-24 NOTE — Telephone Encounter (Signed)
I talk with patient regarding schedule  

## 2019-09-24 NOTE — Assessment & Plan Note (Signed)
09/24/2019: Hemoglobin 6.8, MCV 65.7, platelets 339, ferritin less than 4, TIBC 394, iron saturation 2% Prior treatment: Iron infusion: March 2020 (well ferritin was less than 4 and hemoglobin was 6.2) After the last infusion patient did not come immediately for follow-up  Treatment plan: Recommend Venofer starting today. Patient needs GYN evaluation.  She will need every 100-month iron levels checkups. Thrombocytosis: Secondary to iron deficiency and does not need any active management other than fixing the iron deficiency problem.

## 2019-09-24 NOTE — Patient Instructions (Signed)
Ferumoxytol injection What is this medicine? FERUMOXYTOL is an iron complex. Iron is used to make healthy red blood cells, which carry oxygen and nutrients throughout the body. This medicine is used to treat iron deficiency anemia. This medicine may be used for other purposes; ask your health care provider or pharmacist if you have questions. COMMON BRAND NAME(S): Feraheme What should I tell my health care provider before I take this medicine? They need to know if you have any of these conditions: -anemia not caused by low iron levels -high levels of iron in the blood -magnetic resonance imaging (MRI) test scheduled -an unusual or allergic reaction to iron, other medicines, foods, dyes, or preservatives -pregnant or trying to get pregnant -breast-feeding How should I use this medicine? This medicine is for injection into a vein. It is given by a health care professional in a hospital or clinic setting. Talk to your pediatrician regarding the use of this medicine in children. Special care may be needed. Overdosage: If you think you have taken too much of this medicine contact a poison control center or emergency room at once. NOTE: This medicine is only for you. Do not share this medicine with others. What if I miss a dose? It is important not to miss your dose. Call your doctor or health care professional if you are unable to keep an appointment. What may interact with this medicine? This medicine may interact with the following medications: -other iron products This list may not describe all possible interactions. Give your health care provider a list of all the medicines, herbs, non-prescription drugs, or dietary supplements you use. Also tell them if you smoke, drink alcohol, or use illegal drugs. Some items may interact with your medicine. What should I watch for while using this medicine? Visit your doctor or healthcare professional regularly. Tell your doctor or healthcare professional  if your symptoms do not start to get better or if they get worse. You may need blood work done while you are taking this medicine. You may need to follow a special diet. Talk to your doctor. Foods that contain iron include: whole grains/cereals, dried fruits, beans, or peas, leafy green vegetables, and organ meats (liver, kidney). What side effects may I notice from receiving this medicine? Side effects that you should report to your doctor or health care professional as soon as possible: -allergic reactions like skin rash, itching or hives, swelling of the face, lips, or tongue -breathing problems -changes in blood pressure -feeling faint or lightheaded, falls -fever or chills -flushing, sweating, or hot feelings -swelling of the ankles or feet Side effects that usually do not require medical attention (report to your doctor or health care professional if they continue or are bothersome): -diarrhea -headache -nausea, vomiting -stomach pain This list may not describe all possible side effects. Call your doctor for medical advice about side effects. You may report side effects to FDA at 1-800-FDA-1088. Where should I keep my medicine? This drug is given in a hospital or clinic and will not be stored at home. NOTE: This sheet is a summary. It may not cover all possible information. If you have questions about this medicine, talk to your doctor, pharmacist, or health care provider.  2019 Elsevier/Gold Standard (2016-09-03 20:21:10)  Coronavirus (COVID-19) Are you at risk?  Are you at risk for the Coronavirus (COVID-19)?  To be considered HIGH RISK for Coronavirus (COVID-19), you have to meet the following criteria:  . Traveled to [place], [place], [place], [place] or   [place]; or in the [place] to [place], [place], [place], or [place]; and have fever, cough, and shortness of breath within the last 2 weeks of travel OR . Been in close contact with a person diagnosed with COVID-19  within the last 2 weeks and have fever, cough, and shortness of breath . IF YOU DO NOT MEET THESE CRITERIA, YOU ARE CONSIDERED LOW RISK FOR COVID-19.  What to do if you are HIGH RISK for COVID-19?  . If you are having a medical emergency, call 911. . Seek medical care right away. Before you go to a doctor's office, urgent care or emergency department, call ahead and tell them about your recent travel, contact with someone diagnosed with COVID-19, and your symptoms. You should receive instructions from your physician's office regarding next steps of care.  . When you arrive at healthcare provider, tell the healthcare staff immediately you have returned from visiting [place], [place], [place], [place] or [place]; or traveled in the [place] to [place], [place], [place], or [place]; in the last two weeks or you have been in close contact with a person diagnosed with COVID-19 in the last 2 weeks.   . Tell the health care staff about your symptoms: fever, cough and shortness of breath. . After you have been seen by a medical provider, you will be either: o Tested for (COVID-19) and discharged home on quarantine except to seek medical care if symptoms worsen, and asked to  - Stay home and avoid contact with others until you get your results (4-5 days)  - Avoid travel on public transportation if possible (such as bus, train, or airplane) or o Sent to the Emergency Department by EMS for evaluation, COVID-19 testing, and possible admission depending on your condition and test results.  What to do if you are LOW RISK for COVID-19?  Reduce your risk of any infection by using the same precautions used for avoiding the common cold or flu:  . Wash your hands often with soap and warm water for at least 20 seconds.  If soap and water are not readily available, use an alcohol-based hand sanitizer with at least 60% alcohol.  . If coughing or sneezing, cover your mouth and nose by coughing or sneezing  into the elbow areas of your shirt or coat, into a tissue or into your sleeve (not your hands). . Avoid shaking hands with others and consider head nods or verbal greetings only. . Avoid touching your eyes, nose, or mouth with unwashed hands.  . Avoid close contact with people who are sick. . Avoid places or events with large numbers of people in one location, like concerts or sporting events. . Carefully consider travel plans you have or are making. . If you are planning any travel outside or inside the [place], visit the CDC's Travelers' Health webpage for the latest health notices. . If you have some symptoms but not all symptoms, continue to monitor at home and seek medical attention if your symptoms worsen. . If you are having a medical emergency, call 911.   ADDITIONAL HEALTHCARE OPTIONS FOR PATIENTS  [facility] / e-Visit: [email or web address]         [facility]: [phone]  [facility]: [phone]                   [facility]: [phone]   

## 2019-09-25 ENCOUNTER — Ambulatory Visit (HOSPITAL_COMMUNITY): Payer: Medicaid Other

## 2019-09-25 ENCOUNTER — Other Ambulatory Visit: Payer: Self-pay

## 2019-09-25 ENCOUNTER — Inpatient Hospital Stay: Payer: Medicaid Other

## 2019-09-25 VITALS — BP 114/55 | HR 80 | Temp 98.9°F | Resp 20

## 2019-09-25 DIAGNOSIS — D509 Iron deficiency anemia, unspecified: Secondary | ICD-10-CM

## 2019-09-25 MED ORDER — SODIUM CHLORIDE 0.9 % IV SOLN
200.0000 mg | Freq: Once | INTRAVENOUS | Status: AC
  Administered 2019-09-25: 200 mg via INTRAVENOUS
  Filled 2019-09-25: qty 200

## 2019-09-25 MED ORDER — DIPHENHYDRAMINE HCL 50 MG/ML IJ SOLN
INTRAMUSCULAR | Status: AC
  Filled 2019-09-25: qty 1

## 2019-09-25 MED ORDER — DIPHENHYDRAMINE HCL 50 MG/ML IJ SOLN
25.0000 mg | Freq: Once | INTRAMUSCULAR | Status: AC
  Administered 2019-09-25: 14:00:00 25 mg via INTRAVENOUS

## 2019-09-25 MED ORDER — FAMOTIDINE IN NACL 20-0.9 MG/50ML-% IV SOLN
INTRAVENOUS | Status: AC
  Filled 2019-09-25: qty 50

## 2019-09-25 MED ORDER — FAMOTIDINE IN NACL 20-0.9 MG/50ML-% IV SOLN
20.0000 mg | Freq: Once | INTRAVENOUS | Status: AC
  Administered 2019-09-25: 20 mg via INTRAVENOUS

## 2019-09-25 MED ORDER — SODIUM CHLORIDE 0.9 % IV SOLN
Freq: Once | INTRAVENOUS | Status: AC
  Filled 2019-09-25: qty 250

## 2019-09-25 NOTE — Patient Instructions (Signed)
Iron Sucrose injection What is this medicine? IRON SUCROSE (AHY ern SOO krohs) is an iron complex. Iron is used to make healthy red blood cells, which carry oxygen and nutrients throughout the body. This medicine is used to treat iron deficiency anemia in people with chronic kidney disease. This medicine may be used for other purposes; ask your health care provider or pharmacist if you have questions. COMMON BRAND NAME(S): Venofer What should I tell my health care provider before I take this medicine? They need to know if you have any of these conditions:  anemia not caused by low iron levels  heart disease  high levels of iron in the blood  kidney disease  liver disease  an unusual or allergic reaction to iron, other medicines, foods, dyes, or preservatives  pregnant or trying to get pregnant  breast-feeding How should I use this medicine? This medicine is for infusion into a vein. It is given by a health care professional in a hospital or clinic setting. Talk to your pediatrician regarding the use of this medicine in children. While this drug may be prescribed for children as young as 2 years for selected conditions, precautions do apply. Overdosage: If you think you have taken too much of this medicine contact a poison control center or emergency room at once. NOTE: This medicine is only for you. Do not share this medicine with others. What if I miss a dose? It is important not to miss your dose. Call your doctor or health care professional if you are unable to keep an appointment. What may interact with this medicine? Do not take this medicine with any of the following medications:  deferoxamine  dimercaprol  other iron products This medicine may also interact with the following medications:  chloramphenicol  deferasirox This list may not describe all possible interactions. Give your health care provider a list of all the medicines, herbs, non-prescription drugs, or  dietary supplements you use. Also tell them if you smoke, drink alcohol, or use illegal drugs. Some items may interact with your medicine. What should I watch for while using this medicine? Visit your doctor or healthcare professional regularly. Tell your doctor or healthcare professional if your symptoms do not start to get better or if they get worse. You may need blood work done while you are taking this medicine. You may need to follow a special diet. Talk to your doctor. Foods that contain iron include: whole grains/cereals, dried fruits, beans, or peas, leafy green vegetables, and organ meats (liver, kidney). What side effects may I notice from receiving this medicine? Side effects that you should report to your doctor or health care professional as soon as possible:  allergic reactions like skin rash, itching or hives, swelling of the face, lips, or tongue  breathing problems  changes in blood pressure  cough  fast, irregular heartbeat  feeling faint or lightheaded, falls  fever or chills  flushing, sweating, or hot feelings  joint or muscle aches/pains  seizures  swelling of the ankles or feet  unusually weak or tired Side effects that usually do not require medical attention (report to your doctor or health care professional if they continue or are bothersome):  diarrhea  feeling achy  headache  irritation at site where injected  nausea, vomiting  stomach upset  tiredness This list may not describe all possible side effects. Call your doctor for medical advice about side effects. You may report side effects to FDA at 1-800-FDA-1088. Where should I keep   my medicine? This drug is given in a hospital or clinic and will not be stored at home. NOTE: This sheet is a summary. It may not cover all possible information. If you have questions about this medicine, talk to your doctor, pharmacist, or health care provider.  2020 Elsevier/Gold Standard (2011-04-26  17:14:35) Coronavirus (COVID-19) Are you at risk?  Are you at risk for the Coronavirus (COVID-19)?  To be considered HIGH RISK for Coronavirus (COVID-19), you have to meet the following criteria:  . Traveled to [place], [place], [place], [place] or [place]; or in the [place] to [place], [place], [place], or [place]; and have fever, cough, and shortness of breath within the last 2 weeks of travel OR . Been in close contact with a person diagnosed with COVID-19 within the last 2 weeks and have fever, cough, and shortness of breath . IF YOU DO NOT MEET THESE CRITERIA, YOU ARE CONSIDERED LOW RISK FOR COVID-19.  What to do if you are HIGH RISK for COVID-19?  . If you are having a medical emergency, call 911. . Seek medical care right away. Before you go to a doctor's office, urgent care or emergency department, call ahead and tell them about your recent travel, contact with someone diagnosed with COVID-19, and your symptoms. You should receive instructions from your physician's office regarding next steps of care.  . When you arrive at healthcare provider, tell the healthcare staff immediately you have returned from visiting [place], [place], [place], [place] or [place]; or traveled in the [place] to [place], [place], [place], or [place]; in the last two weeks or you have been in close contact with a person diagnosed with COVID-19 in the last 2 weeks.   . Tell the health care staff about your symptoms: fever, cough and shortness of breath. . After you have been seen by a medical provider, you will be either: o Tested for (COVID-19) and discharged home on quarantine except to seek medical care if symptoms worsen, and asked to  - Stay home and avoid contact with others until you get your results (4-5 days)  - Avoid travel on public transportation if possible (such as bus, train, or airplane) or o Sent to the Emergency Department by EMS for evaluation, COVID-19 testing, and  possible admission depending on your condition and test results.  What to do if you are LOW RISK for COVID-19?  Reduce your risk of any infection by using the same precautions used for avoiding the common cold or flu:  . Wash your hands often with soap and warm water for at least 20 seconds.  If soap and water are not readily available, use an alcohol-based hand sanitizer with at least 60% alcohol.  . If coughing or sneezing, cover your mouth and nose by coughing or sneezing into the elbow areas of your shirt or coat, into a tissue or into your sleeve (not your hands). . Avoid shaking hands with others and consider head nods or verbal greetings only. . Avoid touching your eyes, nose, or mouth with unwashed hands.  . Avoid close contact with people who are sick. . Avoid places or events with large numbers of people in one location, like concerts or sporting events. . Carefully consider travel plans you have or are making. . If you are planning any travel outside or inside the [place], visit the CDC's Travelers' Health webpage for the latest health notices. . If you have some symptoms but not all   symptoms, continue to monitor at home and seek medical attention if your symptoms worsen. . If you are having a medical emergency, call 911.   ADDITIONAL HEALTHCARE OPTIONS FOR PATIENTS  [facility] / e-Visit: [email or web address]         [facility]: [phone]  [facility]: [phone]                   [facility]: [phone]  

## 2019-09-25 NOTE — Progress Notes (Signed)
Pre-meds of Benadryl IV and Pepcid IV given, pt. Tolerated Venofer without difficulty.

## 2019-09-30 ENCOUNTER — Inpatient Hospital Stay: Payer: Medicaid Other | Attending: Hematology and Oncology

## 2019-09-30 ENCOUNTER — Telehealth: Payer: Self-pay | Admitting: Hematology and Oncology

## 2019-09-30 DIAGNOSIS — D509 Iron deficiency anemia, unspecified: Secondary | ICD-10-CM | POA: Insufficient documentation

## 2019-09-30 DIAGNOSIS — Z79899 Other long term (current) drug therapy: Secondary | ICD-10-CM | POA: Insufficient documentation

## 2019-09-30 DIAGNOSIS — D473 Essential (hemorrhagic) thrombocythemia: Secondary | ICD-10-CM | POA: Insufficient documentation

## 2019-09-30 DIAGNOSIS — N920 Excessive and frequent menstruation with regular cycle: Secondary | ICD-10-CM | POA: Insufficient documentation

## 2019-09-30 NOTE — Telephone Encounter (Signed)
Rescheduled per 3/3 sch msg, pt req. Called and spoke with pt, confirmed 3/8 appt

## 2019-10-02 ENCOUNTER — Inpatient Hospital Stay: Payer: Medicaid Other

## 2019-10-02 ENCOUNTER — Other Ambulatory Visit: Payer: Self-pay

## 2019-10-02 VITALS — BP 133/66 | HR 90 | Temp 98.4°F | Resp 18

## 2019-10-02 DIAGNOSIS — D473 Essential (hemorrhagic) thrombocythemia: Secondary | ICD-10-CM | POA: Diagnosis not present

## 2019-10-02 DIAGNOSIS — D509 Iron deficiency anemia, unspecified: Secondary | ICD-10-CM | POA: Diagnosis not present

## 2019-10-02 DIAGNOSIS — N920 Excessive and frequent menstruation with regular cycle: Secondary | ICD-10-CM | POA: Diagnosis not present

## 2019-10-02 DIAGNOSIS — Z79899 Other long term (current) drug therapy: Secondary | ICD-10-CM | POA: Diagnosis not present

## 2019-10-02 MED ORDER — DIPHENHYDRAMINE HCL 50 MG/ML IJ SOLN
25.0000 mg | Freq: Once | INTRAMUSCULAR | Status: AC
  Administered 2019-10-02: 14:00:00 25 mg via INTRAVENOUS

## 2019-10-02 MED ORDER — SODIUM CHLORIDE 0.9 % IV SOLN
200.0000 mg | Freq: Once | INTRAVENOUS | Status: AC
  Administered 2019-10-02: 200 mg via INTRAVENOUS
  Filled 2019-10-02: qty 200

## 2019-10-02 MED ORDER — SODIUM CHLORIDE 0.9 % IV SOLN
Freq: Once | INTRAVENOUS | Status: AC
  Filled 2019-10-02: qty 250

## 2019-10-02 MED ORDER — DIPHENHYDRAMINE HCL 50 MG/ML IJ SOLN
INTRAMUSCULAR | Status: AC
  Filled 2019-10-02: qty 1

## 2019-10-02 MED ORDER — FAMOTIDINE IN NACL 20-0.9 MG/50ML-% IV SOLN
INTRAVENOUS | Status: AC
  Filled 2019-10-02: qty 50

## 2019-10-02 MED ORDER — FAMOTIDINE IN NACL 20-0.9 MG/50ML-% IV SOLN
20.0000 mg | Freq: Once | INTRAVENOUS | Status: AC
  Administered 2019-10-02: 14:00:00 20 mg via INTRAVENOUS

## 2019-10-02 NOTE — Patient Instructions (Signed)
Iron Sucrose injection What is this medicine? IRON SUCROSE (AHY ern SOO krohs) is an iron complex. Iron is used to make healthy red blood cells, which carry oxygen and nutrients throughout the body. This medicine is used to treat iron deficiency anemia in people with chronic kidney disease. This medicine may be used for other purposes; ask your health care provider or pharmacist if you have questions. COMMON BRAND NAME(S): Venofer What should I tell my health care provider before I take this medicine? They need to know if you have any of these conditions:  anemia not caused by low iron levels  heart disease  high levels of iron in the blood  kidney disease  liver disease  an unusual or allergic reaction to iron, other medicines, foods, dyes, or preservatives  pregnant or trying to get pregnant  breast-feeding How should I use this medicine? This medicine is for infusion into a vein. It is given by a health care professional in a hospital or clinic setting. Talk to your pediatrician regarding the use of this medicine in children. While this drug may be prescribed for children as young as 2 years for selected conditions, precautions do apply. Overdosage: If you think you have taken too much of this medicine contact a poison control center or emergency room at once. NOTE: This medicine is only for you. Do not share this medicine with others. What if I miss a dose? It is important not to miss your dose. Call your doctor or health care professional if you are unable to keep an appointment. What may interact with this medicine? Do not take this medicine with any of the following medications:  deferoxamine  dimercaprol  other iron products This medicine may also interact with the following medications:  chloramphenicol  deferasirox This list may not describe all possible interactions. Give your health care provider a list of all the medicines, herbs, non-prescription drugs, or  dietary supplements you use. Also tell them if you smoke, drink alcohol, or use illegal drugs. Some items may interact with your medicine. What should I watch for while using this medicine? Visit your doctor or healthcare professional regularly. Tell your doctor or healthcare professional if your symptoms do not start to get better or if they get worse. You may need blood work done while you are taking this medicine. You may need to follow a special diet. Talk to your doctor. Foods that contain iron include: whole grains/cereals, dried fruits, beans, or peas, leafy green vegetables, and organ meats (liver, kidney). What side effects may I notice from receiving this medicine? Side effects that you should report to your doctor or health care professional as soon as possible:  allergic reactions like skin rash, itching or hives, swelling of the face, lips, or tongue  breathing problems  changes in blood pressure  cough  fast, irregular heartbeat  feeling faint or lightheaded, falls  fever or chills  flushing, sweating, or hot feelings  joint or muscle aches/pains  seizures  swelling of the ankles or feet  unusually weak or tired Side effects that usually do not require medical attention (report to your doctor or health care professional if they continue or are bothersome):  diarrhea  feeling achy  headache  irritation at site where injected  nausea, vomiting  stomach upset  tiredness This list may not describe all possible side effects. Call your doctor for medical advice about side effects. You may report side effects to FDA at 1-800-FDA-1088. Where should I keep   my medicine? This drug is given in a hospital or clinic and will not be stored at home. NOTE: This sheet is a summary. It may not cover all possible information. If you have questions about this medicine, talk to your doctor, pharmacist, or health care provider.  2020 Elsevier/Gold Standard (2011-04-26  17:14:35) Coronavirus (COVID-19) Are you at risk?  Are you at risk for the Coronavirus (COVID-19)?  To be considered HIGH RISK for Coronavirus (COVID-19), you have to meet the following criteria:  . Traveled to [place], [place], [place], [place] or [place]; or in the [place] to [place], [place], [place], or [place]; and have fever, cough, and shortness of breath within the last 2 weeks of travel OR . Been in close contact with a person diagnosed with COVID-19 within the last 2 weeks and have fever, cough, and shortness of breath . IF YOU DO NOT MEET THESE CRITERIA, YOU ARE CONSIDERED LOW RISK FOR COVID-19.  What to do if you are HIGH RISK for COVID-19?  . If you are having a medical emergency, call 911. . Seek medical care right away. Before you go to a doctor's office, urgent care or emergency department, call ahead and tell them about your recent travel, contact with someone diagnosed with COVID-19, and your symptoms. You should receive instructions from your physician's office regarding next steps of care.  . When you arrive at healthcare provider, tell the healthcare staff immediately you have returned from visiting [place], [place], [place], [place] or [place]; or traveled in the [place] to [place], [place], [place], or [place]; in the last two weeks or you have been in close contact with a person diagnosed with COVID-19 in the last 2 weeks.   . Tell the health care staff about your symptoms: fever, cough and shortness of breath. . After you have been seen by a medical provider, you will be either: o Tested for (COVID-19) and discharged home on quarantine except to seek medical care if symptoms worsen, and asked to  - Stay home and avoid contact with others until you get your results (4-5 days)  - Avoid travel on public transportation if possible (such as bus, train, or airplane) or o Sent to the Emergency Department by EMS for evaluation, COVID-19 testing, and  possible admission depending on your condition and test results.  What to do if you are LOW RISK for COVID-19?  Reduce your risk of any infection by using the same precautions used for avoiding the common cold or flu:  . Wash your hands often with soap and warm water for at least 20 seconds.  If soap and water are not readily available, use an alcohol-based hand sanitizer with at least 60% alcohol.  . If coughing or sneezing, cover your mouth and nose by coughing or sneezing into the elbow areas of your shirt or coat, into a tissue or into your sleeve (not your hands). . Avoid shaking hands with others and consider head nods or verbal greetings only. . Avoid touching your eyes, nose, or mouth with unwashed hands.  . Avoid close contact with people who are sick. . Avoid places or events with large numbers of people in one location, like concerts or sporting events. . Carefully consider travel plans you have or are making. . If you are planning any travel outside or inside the [place], visit the CDC's Travelers' Health webpage for the latest health notices. . If you have some symptoms but not all   symptoms, continue to monitor at home and seek medical attention if your symptoms worsen. . If you are having a medical emergency, call 911.   ADDITIONAL HEALTHCARE OPTIONS FOR PATIENTS  [facility] / e-Visit: [email or web address]         [facility]: [phone]  [facility]: [phone]                   [facility]: [phone]  

## 2019-10-05 ENCOUNTER — Inpatient Hospital Stay: Payer: Medicaid Other

## 2019-10-05 ENCOUNTER — Ambulatory Visit (HOSPITAL_COMMUNITY)
Admission: RE | Admit: 2019-10-05 | Discharge: 2019-10-05 | Disposition: A | Discharge status: Home / Self Care | Payer: Medicaid Other | Source: Ambulatory Visit | Attending: Primary Care | Admitting: Primary Care

## 2019-10-05 ENCOUNTER — Other Ambulatory Visit: Payer: Self-pay

## 2019-10-05 VITALS — BP 130/84 | HR 78 | Temp 98.4°F | Resp 16

## 2019-10-05 DIAGNOSIS — N920 Excessive and frequent menstruation with regular cycle: Secondary | ICD-10-CM | POA: Insufficient documentation

## 2019-10-05 DIAGNOSIS — D509 Iron deficiency anemia, unspecified: Secondary | ICD-10-CM

## 2019-10-05 IMAGING — US US PELVIS COMPLETE WITH TRANSVAGINAL
1 series · 13 of 25 positions shown · non-contrast
Comparison: None.

CLINICAL DATA: Menorrhagia, evaluate for fibroids or other causes

EXAM:
TRANSABDOMINAL AND TRANSVAGINAL ULTRASOUND OF PELVIS
DOPPLER ULTRASOUND OF OVARIES
TECHNIQUE: Both transabdominal and transvaginal ultrasound examinations of the
pelvis were performed. Transabdominal technique was performed for
global imaging of the pelvis including uterus, ovaries, adnexal
regions, and pelvic cul-de-sac.
It was necessary to proceed with endovaginal exam following the
transabdominal exam to visualize the right ovary. Color and duplex
Doppler ultrasound was utilized to evaluate blood flow to the
ovaries.

[Series 1: us pelvis complete with transvaginal · 61 acquisitions, 13 frames shown]
[im 1/61]
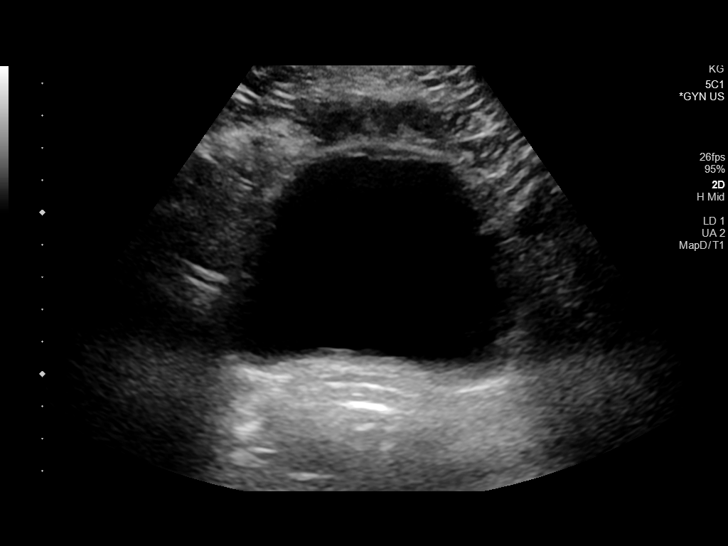
[im 6/61]
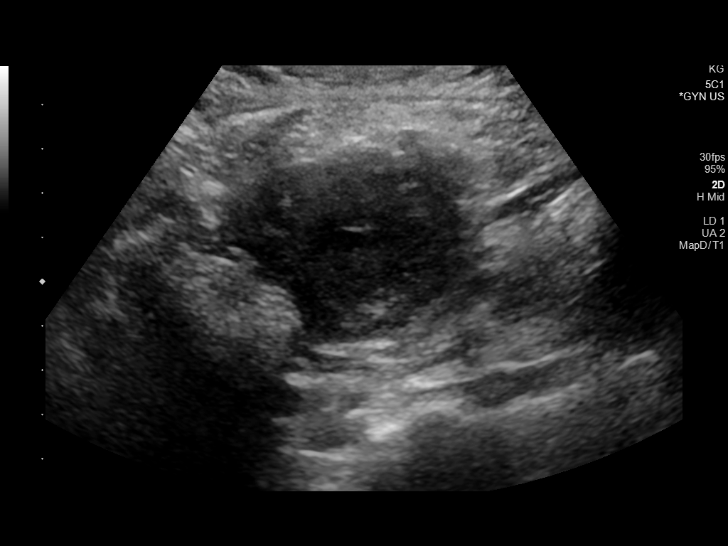
[im 11/61]
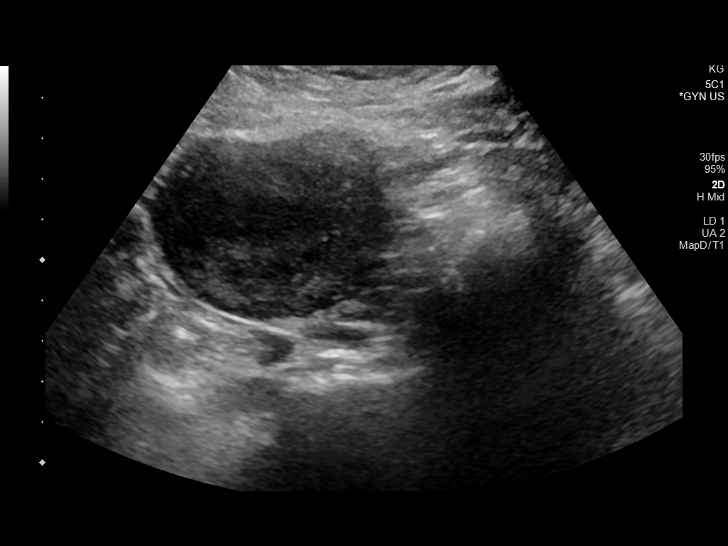
[im 16/61]
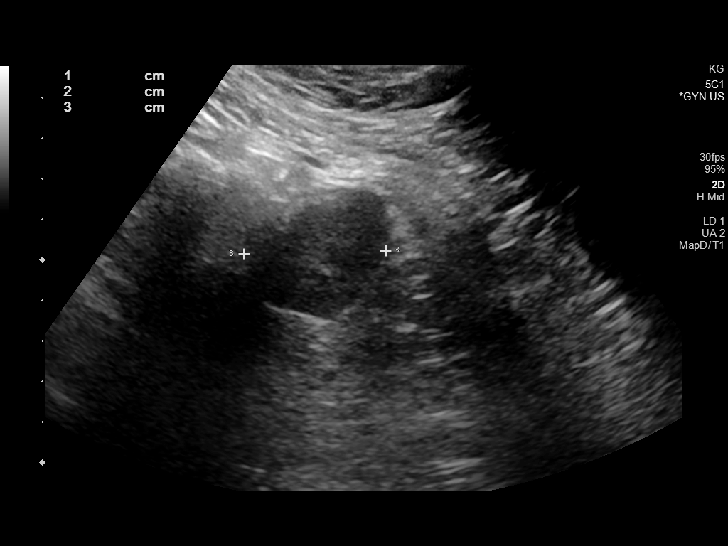
[im 21/61]
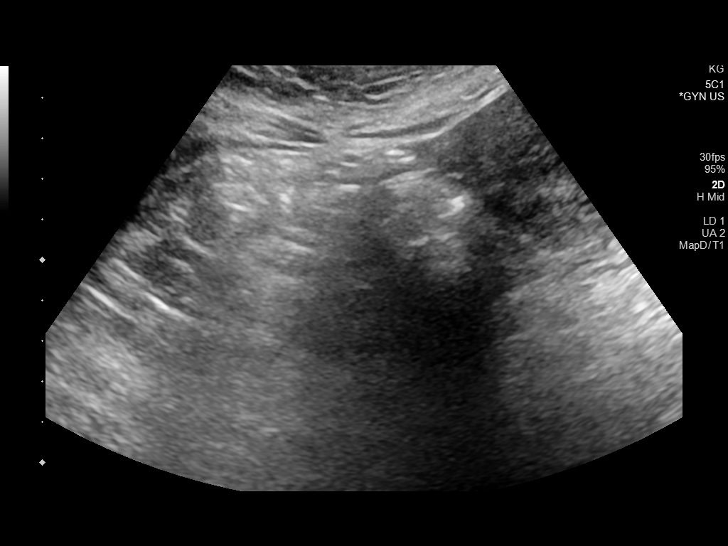
[im 26/61]
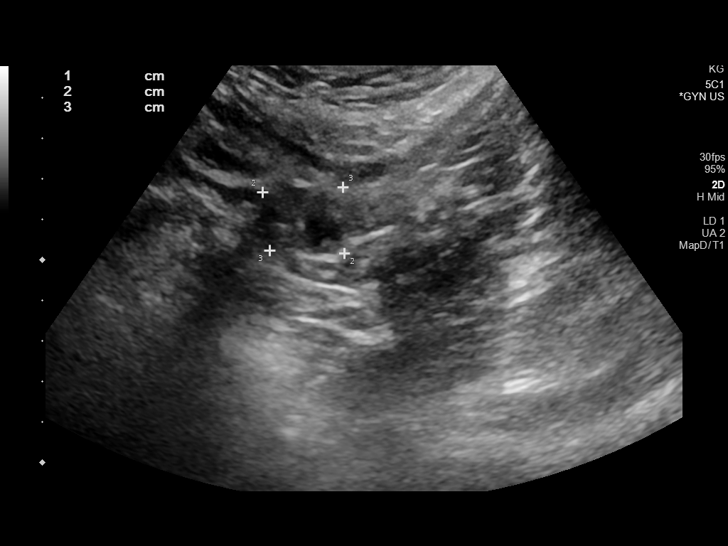
[im 31/61]
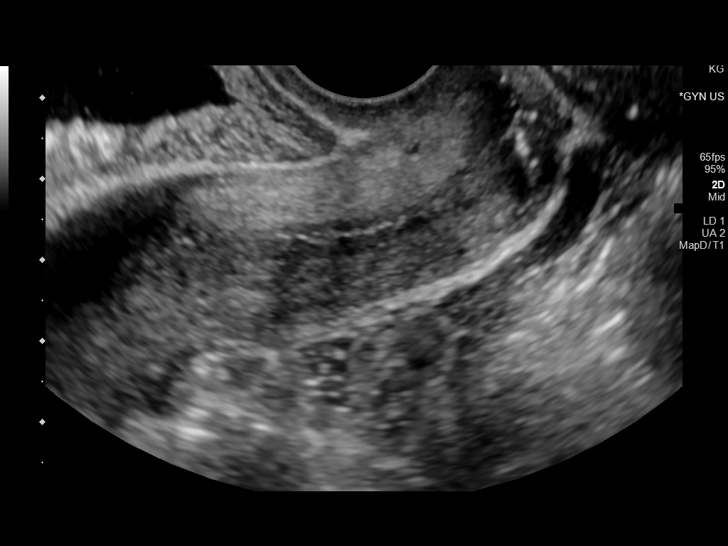
[im 36/61]
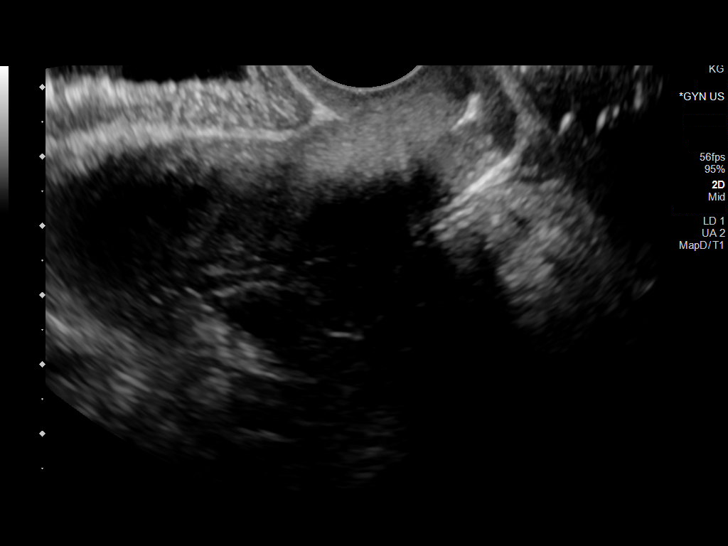
[im 41/61]
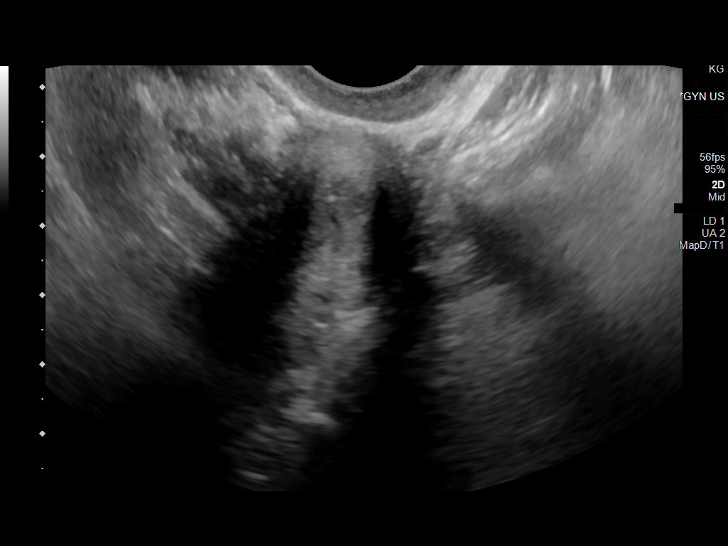
[im 46/61]
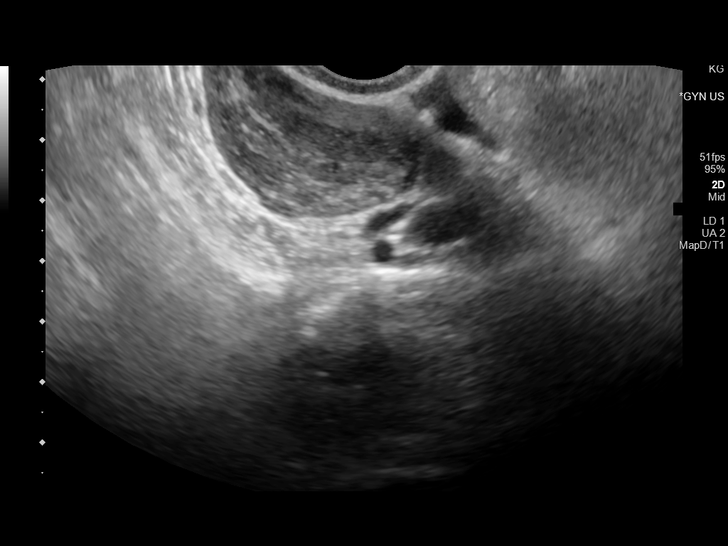
[im 51/61]
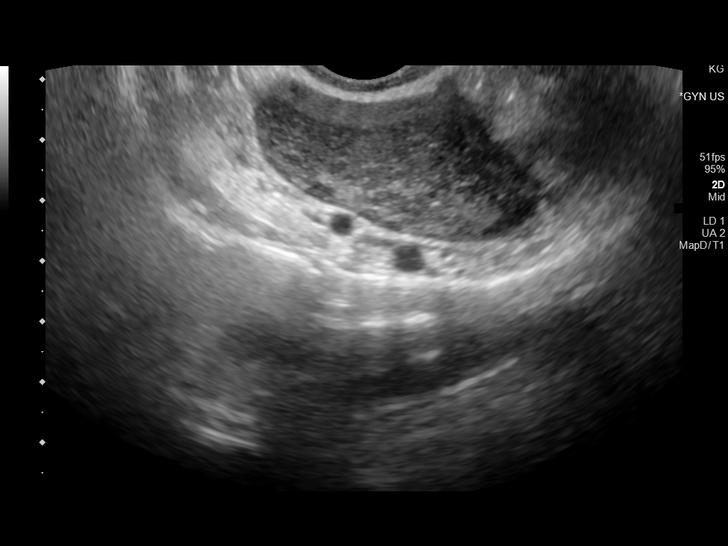
[im 56/61]
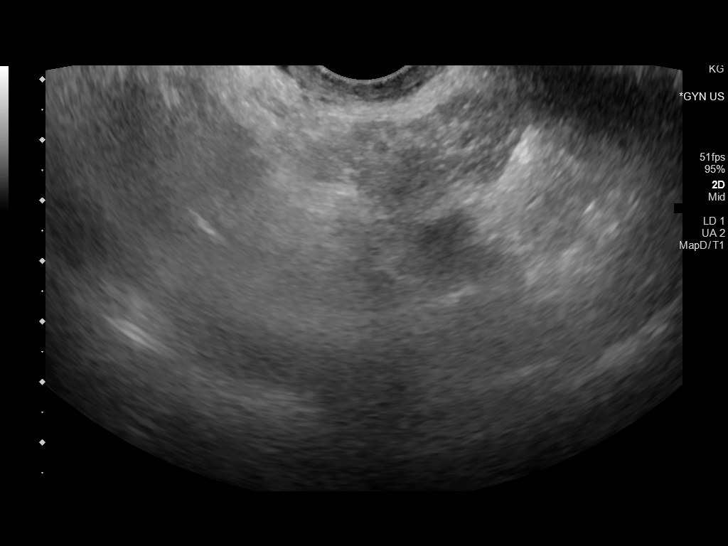
[im 61/61]
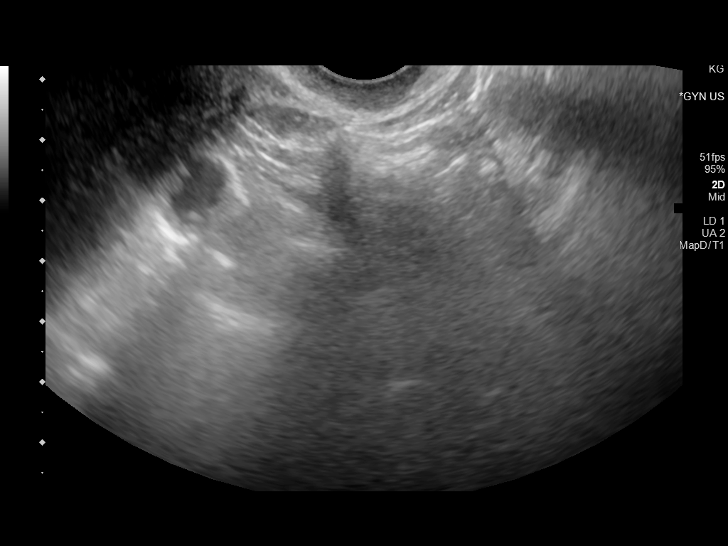

[13 of 25 positions shown; findings below may reference images not displayed]

FINDINGS: Uterus

Measurements: 12.0 x 3.5 x 5.1 cm = volume: 113 mL. No fibroids or
other mass visualized.

Endometrium

Thickness: 2 mm.  No focal abnormality visualized.

Right ovary

Measurements: 4.6 x 2.5 x 4.4 cm = volume: 28 mL. Normal
appearance/no adnexal mass.

Left ovary

Measurements: 4.2 x 2.7 x 3.5 cm = volume: 21 mL. Normal
appearance/no adnexal mass.

Pulsed Doppler evaluation of both ovaries demonstrates normal
low-resistance arterial and venous waveforms.

Other findings

No abnormal free fluid.
IMPRESSION: No fibroids or other ultrasound abnormality of the pelvis to explain
menorrhagia.

## 2019-10-05 MED ORDER — DIPHENHYDRAMINE HCL 50 MG/ML IJ SOLN
25.0000 mg | Freq: Once | INTRAMUSCULAR | Status: AC
  Administered 2019-10-05: 25 mg via INTRAVENOUS

## 2019-10-05 MED ORDER — FAMOTIDINE IN NACL 20-0.9 MG/50ML-% IV SOLN
20.0000 mg | Freq: Once | INTRAVENOUS | Status: AC
  Administered 2019-10-05: 20 mg via INTRAVENOUS

## 2019-10-05 MED ORDER — SODIUM CHLORIDE 0.9 % IV SOLN
200.0000 mg | Freq: Once | INTRAVENOUS | Status: AC
  Administered 2019-10-05: 200 mg via INTRAVENOUS
  Filled 2019-10-05: qty 200

## 2019-10-05 MED ORDER — SODIUM CHLORIDE 0.9 % IV SOLN
Freq: Once | INTRAVENOUS | Status: AC
  Filled 2019-10-05: qty 250

## 2019-10-05 NOTE — Patient Instructions (Signed)

## 2019-12-08 ENCOUNTER — Ambulatory Visit: Payer: Medicaid Other | Attending: Internal Medicine

## 2019-12-08 DIAGNOSIS — Z23 Encounter for immunization: Secondary | ICD-10-CM

## 2019-12-08 NOTE — Progress Notes (Signed)
   Covid-19 Vaccination Clinic  Name:  Nichole Barber    MRN: [id number] DOB: January 15, 1976  12/08/2019  [name] was observed post Covid-19 immunization for 15 minutes without incident. She was provided with Vaccine Information Sheet and instruction to access the V-Safe system.   [name] was instructed to call 911 with any severe reactions post vaccine: Marland Kitchen Difficulty breathing  . Swelling of face and throat  . A fast heartbeat  . A bad rash all over body  . Dizziness and weakness   Immunizations Administered    Name Date Dose VIS Date Route   Pfizer COVID-19 Vaccine 12/08/2019  4:21 PM 0.3 mL 09/23/2018 Intramuscular   Manufacturer: [place]   Lot: [id number]   [place]: [id number]

## 2019-12-15 ENCOUNTER — Other Ambulatory Visit: Payer: Self-pay | Admitting: *Deleted

## 2019-12-15 ENCOUNTER — Inpatient Hospital Stay: Payer: Medicaid Other

## 2019-12-15 DIAGNOSIS — D509 Iron deficiency anemia, unspecified: Secondary | ICD-10-CM

## 2019-12-16 ENCOUNTER — Other Ambulatory Visit: Payer: Self-pay

## 2019-12-16 ENCOUNTER — Inpatient Hospital Stay: Payer: Medicaid Other | Attending: Hematology and Oncology

## 2019-12-16 DIAGNOSIS — Z880 Allergy status to penicillin: Secondary | ICD-10-CM | POA: Insufficient documentation

## 2019-12-16 DIAGNOSIS — Z881 Allergy status to other antibiotic agents status: Secondary | ICD-10-CM | POA: Diagnosis not present

## 2019-12-16 DIAGNOSIS — Z79899 Other long term (current) drug therapy: Secondary | ICD-10-CM | POA: Diagnosis not present

## 2019-12-16 DIAGNOSIS — R7989 Other specified abnormal findings of blood chemistry: Secondary | ICD-10-CM | POA: Diagnosis not present

## 2019-12-16 DIAGNOSIS — N920 Excessive and frequent menstruation with regular cycle: Secondary | ICD-10-CM | POA: Diagnosis not present

## 2019-12-16 DIAGNOSIS — R5383 Other fatigue: Secondary | ICD-10-CM | POA: Diagnosis not present

## 2019-12-16 DIAGNOSIS — D500 Iron deficiency anemia secondary to blood loss (chronic): Secondary | ICD-10-CM | POA: Insufficient documentation

## 2019-12-16 DIAGNOSIS — D509 Iron deficiency anemia, unspecified: Secondary | ICD-10-CM

## 2019-12-16 LAB — CBC WITH DIFFERENTIAL (CANCER CENTER ONLY)
Abs Immature Granulocytes: 0.01 10*3/uL (ref 0.00–0.07)
Basophils Absolute: 0.1 10*3/uL (ref 0.0–0.1)
Basophils Relative: 1 %
Eosinophils Absolute: 0.3 10*3/uL (ref 0.0–0.5)
Eosinophils Relative: 4 %
HCT: 32.7 % — ABNORMAL LOW (ref 36.0–46.0)
Hemoglobin: 9.5 g/dL — ABNORMAL LOW (ref 12.0–15.0)
Immature Granulocytes: 0 %
Lymphocytes Relative: 37 %
Lymphs Abs: 2.3 10*3/uL (ref 0.7–4.0)
MCH: 21.7 pg — ABNORMAL LOW (ref 26.0–34.0)
MCHC: 29.1 g/dL — ABNORMAL LOW (ref 30.0–36.0)
MCV: 74.8 fL — ABNORMAL LOW (ref 80.0–100.0)
Monocytes Absolute: 0.5 10*3/uL (ref 0.1–1.0)
Monocytes Relative: 8 %
Neutro Abs: 3.1 10*3/uL (ref 1.7–7.7)
Neutrophils Relative %: 50 %
Platelet Count: 520 10*3/uL — ABNORMAL HIGH (ref 150–400)
RBC: 4.37 MIL/uL (ref 3.87–5.11)
RDW: 18.6 % — ABNORMAL HIGH (ref 11.5–15.5)
WBC Count: 6.2 10*3/uL (ref 4.0–10.5)
nRBC: 0 % (ref 0.0–0.2)

## 2019-12-17 LAB — IRON AND TIBC
Iron: 20 ug/dL — ABNORMAL LOW (ref 41–142)
Saturation Ratios: 5 % — ABNORMAL LOW (ref 21–57)
TIBC: 379 ug/dL (ref 236–444)
UIBC: 359 ug/dL (ref 120–384)

## 2019-12-17 LAB — FERRITIN: Ferritin: <4 ng/mL — ABNORMAL LOW (ref 11–307)

## 2019-12-21 NOTE — Progress Notes (Signed)
Patient Care Team: [facility], [facility] as PCP - General (General Practice)  DIAGNOSIS:    ICD-10-CM   1. Hypochromic microcytic anemia  D50.9     CHIEF COMPLIANT: Follow-up of severe iron deficiencyanemia and thrombocytosis  INTERVAL HISTORY: Nichole Barber is a 44 y.o. with above-mentioned history of severe iron deficiencyanemia treated with IV iron (last 10/05/19), and thrombocytosis. Lab work from 12/16/19 showed Hg 9.5, HCT 32.7, MCV 74.8, platelets 520, iron saturation 5%, ferritin <4.She presents to the clinic today for follow-up.  She reports that she has been feeling much better than before but not completely up to normal.  She still has moderate degree of fatigue.  She continues to have heavy menstrual cycles that last for 7 days.  She has previously seen a gynecologist but has not seen them again.  ALLERGIES:  is allergic to amoxicillin-pot clavulanate; feraheme [ferumoxytol]; metronidazole; and pineapple.  MEDICATIONS:  Current Outpatient Medications  Medication Sig Dispense Refill  . albuterol (VENTOLIN HFA) 108 (90 Base) MCG/ACT inhaler Frequency:PHARMDIR   Dosage:90   MCG  Instructions:  Note:used inhaler as needed daily for asthma attacks Dose: 90 MCG    . beclomethasone (QVAR) 40 MCG/ACT inhaler Frequency:PHARMDIR   Dosage:40   MCG  Instructions:  Note:use q var inhaler twice daily; if dose is lower than home dose please provide 80 mcg inhaler Dose: 40 MCG    . Cetirizine HCl 10 MG CAPS 10 mg.    . ferrous sulfate (EQL SLOW RELEASE IRON) 160 (50 Fe) MG TBCR SR tablet Take 1 tablet (160 mg total) by mouth daily. 30 each 6  . Fluocinolone Acetonide Body 0.01 % OIL 0.01 %.    . fluticasone (FLONASE) 50 MCG/ACT nasal spray FLUTICASONE PROPIONATE 50 MCG/ACT SUSP    . ibuprofen (ADVIL,MOTRIN) 800 MG tablet Take 1 tablet (800 mg total) by mouth every 8 (eight) hours as needed. (Patient not taking: Reported on 02/22/2017) 30 tablet 0  . montelukast (SINGULAIR)  10 MG tablet 10 mg.    . mupirocin cream (BACTROBAN) 2 % 2 %.    Marland Kitchen omeprazole (PRILOSEC) 40 MG capsule 40 mg.    . Respiratory Therapy Supplies (PARI BABY CONVERSION KIT) MISC NEBULIZER    . SUMAtriptan (IMITREX) 25 MG tablet 25 mg.     No current facility-administered medications for this visit.    PHYSICAL EXAMINATION: ECOG PERFORMANCE STATUS: 1 - Symptomatic but completely ambulatory  There were no vitals filed for this visit. There were no vitals filed for this visit.  LABORATORY DATA:  I have reviewed the data as listed CMP Latest Ref Rng & Units 05/01/2017 02/22/2017  Glucose 65 - 99 mg/dL 118(H) 94  BUN 6 - 20 mg/dL 9 8  Creatinine 0.44 - 1.00 mg/dL 0.69 0.65  Sodium 135 - 145 mmol/L 137 138  Potassium 3.5 - 5.1 mmol/L 3.4(L) 3.6  Chloride 101 - 111 mmol/L 105 106  CO2 22 - 32 mmol/L 25 26  Calcium 8.9 - 10.3 mg/dL 8.7(L) 8.6(L)  Total Protein 6.5 - 8.1 g/dL 7.7 7.6  Total Bilirubin 0.3 - 1.2 mg/dL 0.3 0.2(L)  Alkaline Phos 38 - 126 U/L 71 84  AST 15 - 41 U/L 19 21  ALT 14 - 54 U/L 9(L) 11(L)    Lab Results  Component Value Date   WBC 6.2 12/16/2019   HGB 9.5 (L) 12/16/2019   HCT 32.7 (L) 12/16/2019   MCV 74.8 (L) 12/16/2019   PLT 520 (H) 12/16/2019   NEUTROABS  3.1 12/16/2019    ASSESSMENT & PLAN:  Hypochromic microcytic anemia 12/16/2019: Hemoglobin 9.5, MCV 74.8, platelets 520, ferritin less than 4, iron saturation 5%, TIBC 379 Prior treatment: Iron infusion: March 2020, Feb 2021   Treatment plan: Recommend Venofer starting today. Patient needs GYN evaluation.   She continues to have heavy menstrual cycles that last for 7 days. I discussed with her about getting a GYN evaluation once again.  She says she will make it.  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  Slightly better since her recent iron infusions in February.  Return to clinic in 4 months with labs done ahead of time and IV iron appointments.    No orders of the defined types were  placed in this encounter.  The patient has a good understanding of the overall plan. she agrees with it. she will call with any problems that may develop before the next visit here.  Total time spent: 20 mins including face to face time and time spent for planning, charting and coordination of care  Nicholas Lose, MD 12/22/2019  I, Cloyde Reams Dorshimer, am acting as scribe for Dr. Nicholas Lose.  I have reviewed the above documentation for accuracy and completeness, and I agree with the above.

## 2019-12-22 ENCOUNTER — Inpatient Hospital Stay: Payer: Medicaid Other

## 2019-12-22 ENCOUNTER — Other Ambulatory Visit: Payer: Self-pay

## 2019-12-22 ENCOUNTER — Inpatient Hospital Stay (HOSPITAL_BASED_OUTPATIENT_CLINIC_OR_DEPARTMENT_OTHER): Payer: Medicaid Other | Admitting: Hematology and Oncology

## 2019-12-22 VITALS — BP 136/79 | HR 86 | Temp 98.1°F | Resp 18

## 2019-12-22 DIAGNOSIS — D509 Iron deficiency anemia, unspecified: Secondary | ICD-10-CM

## 2019-12-22 DIAGNOSIS — D500 Iron deficiency anemia secondary to blood loss (chronic): Secondary | ICD-10-CM | POA: Diagnosis not present

## 2019-12-22 MED ORDER — FAMOTIDINE IN NACL 20-0.9 MG/50ML-% IV SOLN
INTRAVENOUS | Status: AC
  Filled 2019-12-22: qty 50

## 2019-12-22 MED ORDER — SODIUM CHLORIDE 0.9 % IV SOLN
200.0000 mg | Freq: Once | INTRAVENOUS | Status: AC
  Administered 2019-12-22: 200 mg via INTRAVENOUS
  Filled 2019-12-22: qty 200

## 2019-12-22 MED ORDER — DIPHENHYDRAMINE HCL 50 MG/ML IJ SOLN
25.0000 mg | Freq: Once | INTRAMUSCULAR | Status: AC
  Administered 2019-12-22: 25 mg via INTRAVENOUS

## 2019-12-22 MED ORDER — FAMOTIDINE IN NACL 20-0.9 MG/50ML-% IV SOLN
20.0000 mg | Freq: Once | INTRAVENOUS | Status: AC
  Administered 2019-12-22: 20 mg via INTRAVENOUS

## 2019-12-22 MED ORDER — DIPHENHYDRAMINE HCL 50 MG/ML IJ SOLN
INTRAMUSCULAR | Status: AC
  Filled 2019-12-22: qty 1

## 2019-12-22 MED ORDER — SODIUM CHLORIDE 0.9 % IV SOLN
Freq: Once | INTRAVENOUS | Status: AC
  Filled 2019-12-22: qty 250

## 2019-12-22 NOTE — Assessment & Plan Note (Signed)
12/16/2019: Hemoglobin 9.5, MCV 74.8, platelets 520, ferritin less than 4, iron saturation 5%, TIBC 379 Prior treatment: Iron infusion: March 2020, Feb 2021   Treatment plan: Recommend Venofer starting today. Patient needs GYN evaluation.  She has an appointment with gynecology coming up.  She will need every 62-month iron levels checkups. 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  Slightly better since her recent iron infusions in February.  Return to clinic in 3 months with labs and IV iron appointments.

## 2019-12-22 NOTE — Patient Instructions (Signed)

## 2019-12-29 ENCOUNTER — Inpatient Hospital Stay: Payer: Medicaid Other

## 2019-12-29 ENCOUNTER — Ambulatory Visit: Payer: Medicaid Other | Attending: Internal Medicine

## 2019-12-29 ENCOUNTER — Telehealth: Payer: Self-pay | Admitting: Hematology and Oncology

## 2019-12-29 ENCOUNTER — Other Ambulatory Visit: Payer: Self-pay | Admitting: Hematology and Oncology

## 2019-12-29 DIAGNOSIS — Z23 Encounter for immunization: Secondary | ICD-10-CM

## 2019-12-29 NOTE — Progress Notes (Signed)
   Covid-19 Vaccination Clinic  Name:  Nichole Barber    MRN: [id number] DOB: 03-Mar-1976  12/29/2019  [name] was observed post Covid-19 immunization for 15 minutes without incident. She was provided with Vaccine Information Sheet and instruction to access the V-Safe system.   [name] was instructed to call 911 with any severe reactions post vaccine: Marland Kitchen Difficulty breathing  . Swelling of face and throat  . A fast heartbeat  . A bad rash all over body  . Dizziness and weakness   Immunizations Administered    Name Date Dose VIS Date Route   Pfizer COVID-19 Vaccine 12/29/2019 12:41 PM 0.3 mL 09/23/2018 Intramuscular   Manufacturer: [place]   Lot: [id number]   [place]: [id number]

## 2019-12-29 NOTE — Telephone Encounter (Signed)
Called patient regarding 06/01 appointment, patient will not be able to come in and has rescheduled for 06/03. Time and date approved per infusion charge nurse, patient is aware of rescheduled appointment.

## 2019-12-31 ENCOUNTER — Inpatient Hospital Stay: Payer: Medicaid Other

## 2020-01-05 ENCOUNTER — Inpatient Hospital Stay: Payer: Medicaid Other | Attending: Hematology and Oncology

## 2020-01-05 ENCOUNTER — Other Ambulatory Visit: Payer: Self-pay

## 2020-01-05 VITALS — BP 126/83 | HR 91 | Temp 98.7°F | Resp 18

## 2020-01-05 DIAGNOSIS — D509 Iron deficiency anemia, unspecified: Secondary | ICD-10-CM | POA: Insufficient documentation

## 2020-01-05 DIAGNOSIS — D473 Essential (hemorrhagic) thrombocythemia: Secondary | ICD-10-CM | POA: Insufficient documentation

## 2020-01-05 DIAGNOSIS — Z79899 Other long term (current) drug therapy: Secondary | ICD-10-CM | POA: Diagnosis not present

## 2020-01-05 MED ORDER — SODIUM CHLORIDE 0.9 % IV SOLN
Freq: Once | INTRAVENOUS | Status: AC
  Filled 2020-01-05: qty 250

## 2020-01-05 MED ORDER — SODIUM CHLORIDE 0.9 % IV SOLN
200.0000 mg | Freq: Once | INTRAVENOUS | Status: AC
  Administered 2020-01-05: 200 mg via INTRAVENOUS
  Filled 2020-01-05: qty 200

## 2020-01-05 MED ORDER — DIPHENHYDRAMINE HCL 50 MG/ML IJ SOLN
25.0000 mg | Freq: Once | INTRAMUSCULAR | Status: AC
  Administered 2020-01-05: 25 mg via INTRAVENOUS

## 2020-01-05 MED ORDER — FAMOTIDINE IN NACL 20-0.9 MG/50ML-% IV SOLN
INTRAVENOUS | Status: AC
  Filled 2020-01-05: qty 50

## 2020-01-05 MED ORDER — FAMOTIDINE IN NACL 20-0.9 MG/50ML-% IV SOLN
20.0000 mg | Freq: Once | INTRAVENOUS | Status: AC
  Administered 2020-01-05: 20 mg via INTRAVENOUS

## 2020-01-05 MED ORDER — DIPHENHYDRAMINE HCL 50 MG/ML IJ SOLN
INTRAMUSCULAR | Status: AC
  Filled 2020-01-05: qty 1

## 2020-01-05 NOTE — Patient Instructions (Signed)

## 2020-01-12 ENCOUNTER — Inpatient Hospital Stay: Payer: Medicaid Other

## 2020-01-12 ENCOUNTER — Other Ambulatory Visit: Payer: Self-pay

## 2020-01-12 VITALS — BP 128/76 | HR 88 | Temp 98.2°F | Resp 18

## 2020-01-12 DIAGNOSIS — D509 Iron deficiency anemia, unspecified: Secondary | ICD-10-CM

## 2020-01-12 MED ORDER — SODIUM CHLORIDE 0.9 % IV SOLN
200.0000 mg | Freq: Once | INTRAVENOUS | Status: AC
  Administered 2020-01-12: 200 mg via INTRAVENOUS
  Filled 2020-01-12: qty 200

## 2020-01-12 MED ORDER — FAMOTIDINE IN NACL 20-0.9 MG/50ML-% IV SOLN
20.0000 mg | Freq: Once | INTRAVENOUS | Status: AC
  Administered 2020-01-12: 20 mg via INTRAVENOUS

## 2020-01-12 MED ORDER — DIPHENHYDRAMINE HCL 50 MG/ML IJ SOLN
INTRAMUSCULAR | Status: AC
  Filled 2020-01-12: qty 1

## 2020-01-12 MED ORDER — DIPHENHYDRAMINE HCL 50 MG/ML IJ SOLN
25.0000 mg | Freq: Once | INTRAMUSCULAR | Status: AC
  Administered 2020-01-12: 25 mg via INTRAVENOUS

## 2020-01-12 MED ORDER — FAMOTIDINE IN NACL 20-0.9 MG/50ML-% IV SOLN
INTRAVENOUS | Status: AC
  Filled 2020-01-12: qty 50

## 2020-01-12 NOTE — Progress Notes (Signed)
Pt declined to stay for 30 minute post-observation after iron 

## 2020-01-12 NOTE — Patient Instructions (Signed)

## 2020-01-14 ENCOUNTER — Telehealth: Payer: Self-pay | Admitting: Hematology and Oncology

## 2020-01-14 NOTE — Telephone Encounter (Signed)
Scheduled per los, patient has been called and could not leave a voicemail. Calender will be mailed.

## 2020-01-19 ENCOUNTER — Inpatient Hospital Stay: Payer: Medicaid Other

## 2020-01-19 ENCOUNTER — Other Ambulatory Visit: Payer: Self-pay

## 2020-01-19 VITALS — BP 133/77 | HR 81 | Temp 98.7°F | Resp 18

## 2020-01-19 DIAGNOSIS — D509 Iron deficiency anemia, unspecified: Secondary | ICD-10-CM

## 2020-01-19 MED ORDER — FAMOTIDINE IN NACL 20-0.9 MG/50ML-% IV SOLN
INTRAVENOUS | Status: AC
  Filled 2020-01-19: qty 50

## 2020-01-19 MED ORDER — DIPHENHYDRAMINE HCL 50 MG/ML IJ SOLN
25.0000 mg | Freq: Once | INTRAMUSCULAR | Status: AC
  Administered 2020-01-19: 25 mg via INTRAVENOUS

## 2020-01-19 MED ORDER — SODIUM CHLORIDE 0.9 % IV SOLN
200.0000 mg | Freq: Once | INTRAVENOUS | Status: AC
  Administered 2020-01-19: 200 mg via INTRAVENOUS
  Filled 2020-01-19: qty 200

## 2020-01-19 MED ORDER — SODIUM CHLORIDE 0.9 % IV SOLN
INTRAVENOUS | Status: DC
  Filled 2020-01-19: qty 250

## 2020-01-19 MED ORDER — DIPHENHYDRAMINE HCL 50 MG/ML IJ SOLN
INTRAMUSCULAR | Status: AC
  Filled 2020-01-19: qty 1

## 2020-01-19 MED ORDER — FAMOTIDINE IN NACL 20-0.9 MG/50ML-% IV SOLN
20.0000 mg | Freq: Once | INTRAVENOUS | Status: AC
  Administered 2020-01-19: 20 mg via INTRAVENOUS

## 2020-01-19 NOTE — Patient Instructions (Signed)

## 2020-01-26 ENCOUNTER — Ambulatory Visit: Payer: Medicaid Other

## 2020-01-26 ENCOUNTER — Inpatient Hospital Stay: Payer: Medicaid Other

## 2020-01-26 ENCOUNTER — Other Ambulatory Visit: Payer: Self-pay

## 2020-01-26 VITALS — BP 132/73 | HR 83 | Temp 98.9°F | Resp 17

## 2020-01-26 DIAGNOSIS — D509 Iron deficiency anemia, unspecified: Secondary | ICD-10-CM

## 2020-01-26 MED ORDER — FAMOTIDINE IN NACL 20-0.9 MG/50ML-% IV SOLN
INTRAVENOUS | Status: AC
  Filled 2020-01-26: qty 50

## 2020-01-26 MED ORDER — FAMOTIDINE IN NACL 20-0.9 MG/50ML-% IV SOLN
20.0000 mg | Freq: Once | INTRAVENOUS | Status: AC
  Administered 2020-01-26: 20 mg via INTRAVENOUS

## 2020-01-26 MED ORDER — SODIUM CHLORIDE 0.9 % IV SOLN
200.0000 mg | Freq: Once | INTRAVENOUS | Status: AC
  Administered 2020-01-26: 200 mg via INTRAVENOUS
  Filled 2020-01-26: qty 200

## 2020-01-26 MED ORDER — SODIUM CHLORIDE 0.9 % IV SOLN
Freq: Once | INTRAVENOUS | Status: AC
  Filled 2020-01-26: qty 250

## 2020-01-26 MED ORDER — DIPHENHYDRAMINE HCL 50 MG/ML IJ SOLN
25.0000 mg | Freq: Once | INTRAMUSCULAR | Status: AC
  Administered 2020-01-26: 25 mg via INTRAVENOUS

## 2020-01-26 MED ORDER — DIPHENHYDRAMINE HCL 50 MG/ML IJ SOLN
INTRAMUSCULAR | Status: AC
  Filled 2020-01-26: qty 1

## 2020-01-26 NOTE — Patient Instructions (Signed)

## 2020-03-21 ENCOUNTER — Other Ambulatory Visit: Payer: Self-pay

## 2020-03-21 ENCOUNTER — Ambulatory Visit (HOSPITAL_COMMUNITY)
Admission: EM | Admit: 2020-03-21 | Discharge: 2020-03-21 | Disposition: A | Discharge status: Home / Self Care | Payer: Medicaid Other | Attending: Urgent Care | Admitting: Urgent Care

## 2020-03-21 ENCOUNTER — Encounter (HOSPITAL_COMMUNITY): Payer: Self-pay

## 2020-03-21 DIAGNOSIS — M79645 Pain in left finger(s): Secondary | ICD-10-CM

## 2020-03-21 DIAGNOSIS — S61211A Laceration without foreign body of left index finger without damage to nail, initial encounter: Secondary | ICD-10-CM

## 2020-03-21 MED ORDER — LIDOCAINE HCL 2 % IJ SOLN
INTRAMUSCULAR | Status: AC
  Filled 2020-03-21: qty 20

## 2020-03-21 MED ORDER — NAPROXEN 375 MG PO TABS: 375.0000 mg | ORAL_TABLET | Freq: Two times a day (BID) | ORAL | 0 refills | Status: DC

## 2020-03-21 NOTE — Discharge Instructions (Signed)

## 2020-03-21 NOTE — ED Provider Notes (Signed)
[facility]   MRN: [id number] DOB: [birth year]  Subjective:   Nichole Barber is a 44 y.o. female presenting for suffering left index finger laceration this morning while using her shift knife.  Patient tried apply pressure to stop the bleeding, did not get a chance to clean her wound.  Her Tdap is up-to-date.  No current facility-administered medications for this encounter.  Current Outpatient Medications:    albuterol (VENTOLIN HFA) 108 (90 Base) MCG/ACT inhaler, Frequency:PHARMDIR   Dosage:90   MCG  Instructions:  Note:used inhaler as needed daily for asthma attacks Dose: 90 MCG, Disp: , Rfl:    beclomethasone (QVAR) 40 MCG/ACT inhaler, Frequency:PHARMDIR   Dosage:40   MCG  Instructions:  Note:use q var inhaler twice daily; if dose is lower than home dose please provide 80 mcg inhaler Dose: 40 MCG, Disp: , Rfl:    Cetirizine HCl 10 MG CAPS, 10 mg., Disp: , Rfl:    ferrous sulfate (EQL SLOW RELEASE IRON) 160 (50 Fe) MG TBCR SR tablet, Take 1 tablet (160 mg total) by mouth daily., Disp: 30 each, Rfl: 6   Fluocinolone Acetonide Body 0.01 % OIL, 0.01 %., Disp: , Rfl:    fluticasone (FLONASE) 50 MCG/ACT nasal spray, FLUTICASONE PROPIONATE 50 MCG/ACT SUSP, Disp: , Rfl:    ibuprofen (ADVIL,MOTRIN) 800 MG tablet, Take 1 tablet (800 mg total) by mouth every 8 (eight) hours as needed. (Patient not taking: Reported on 02/22/2017), Disp: 30 tablet, Rfl: 0   montelukast (SINGULAIR) 10 MG tablet, 10 mg., Disp: , Rfl:    mupirocin cream (BACTROBAN) 2 %, 2 %., Disp: , Rfl:    omeprazole (PRILOSEC) 40 MG capsule, 40 mg., Disp: , Rfl:    Respiratory Therapy Supplies (PARI BABY CONVERSION KIT) MISC, NEBULIZER, Disp: , Rfl:    SUMAtriptan (IMITREX) 25 MG tablet, 25 mg., Disp: , Rfl:    Allergies  Allergen Reactions   Amoxicillin-Pot Clavulanate Hives   Feraheme [Ferumoxytol] Shortness Of Breath    Tolerated feraheme w/ famotidine and diphenhydramine premedication.   Metronidazole  Hives   Pineapple Hives    Past Medical History:  Diagnosis Date   Asthma      Past Surgical History:  Procedure Laterality Date   CARPAL TUNNEL RELEASE     CESAREAN SECTION     KNEE SURGERY      No family history on file.  Social History   Tobacco Use   Smoking status: Current Every Day Smoker   Smokeless tobacco: Never Used  Substance Use Topics   Alcohol use: No   Drug use: No    ROS   Objective:   Vitals: BP 131/86 (BP Location: Right Arm)    Pulse 99    Temp 99.1 F (37.3 C) (Oral)    Resp 16    SpO2 100%   Physical Exam Constitutional:      General: She is not in acute distress.    Appearance: Normal appearance. She is well-developed. She is not ill-appearing.  HENT:     Head: Normocephalic and atraumatic.     Nose: Nose normal.     Mouth/Throat:     Mouth: Mucous membranes are moist.     Pharynx: Oropharynx is clear.  Eyes:     General: No scleral icterus.    Extraocular Movements: Extraocular movements intact.     Pupils: Pupils are equal, round, and reactive to light.  Cardiovascular:     Rate and Rhythm: Normal rate.  Pulmonary:     Effort:  Pulmonary effort is normal.  Musculoskeletal:       Hands:  Skin:    General: Skin is warm and dry.  Neurological:     General: No focal deficit present.     Mental Status: She is alert and oriented to person, place, and time.  Psychiatric:        Mood and Affect: Mood normal.        Behavior: Behavior normal.     PROCEDURE NOTE: laceration repair Verbal consent obtained from patient.  Local anesthesia with 2cc Lidocaine 2% without epinephrine.  Wound explored for tendon, ligament damage. Wound scrubbed with soap and water and rinsed. Wound closed with #5 4-0 Prolene (4 simple interrupted, 1 horizontal mattress) sutures.  Wound cleansed and dressed.   Assessment and Plan :   PDMP not reviewed this encounter.  1. Finger pain, left   2. Laceration of left index finger without foreign  body, nail damage status unspecified, initial encounter     Laceration repaired successfully.  No need to update Tdap.  Wound care reviewed.  Return to clinic in 10 days for suture removal.  Use naproxen for pain and inflammation. Counseled patient on potential for adverse effects with medications prescribed/recommended today, ER and return-to-clinic precautions discussed, patient verbalized understanding.    Jaynee Eagles, [place] 03/21/20 [phone]

## 2020-03-21 NOTE — ED Triage Notes (Signed)
Pt present laceration to the left hand/ index finger. Pt cut her finger this am with a knife. She is update on tetanus shot.

## 2020-03-26 ENCOUNTER — Ambulatory Visit (HOSPITAL_COMMUNITY)
Admission: EM | Admit: 2020-03-26 | Discharge: 2020-03-26 | Disposition: A | Discharge status: Home / Self Care | Payer: Medicaid Other | Attending: Emergency Medicine | Admitting: Emergency Medicine

## 2020-03-26 ENCOUNTER — Encounter (HOSPITAL_COMMUNITY): Payer: Self-pay

## 2020-03-26 DIAGNOSIS — L03012 Cellulitis of left finger: Secondary | ICD-10-CM | POA: Diagnosis not present

## 2020-03-26 MED ORDER — DOXYCYCLINE HYCLATE 100 MG PO CAPS
100.0000 mg | ORAL_CAPSULE | Freq: Two times a day (BID) | ORAL | 0 refills | Status: AC
Start: 2020-03-26 — End: 2020-04-02

## 2020-03-26 NOTE — Discharge Instructions (Signed)
Take the antibiotic as directed.  Keep your wound clean and dry.  Wash it gently twice a day with soap and water.    Your stitches need to be taken out 7-10 days from when they were put in.   Return here if you see signs of infection, such as increased pain, redness, pus-like drainage, warmth, fever, chills, or other concerning symptoms.

## 2020-03-26 NOTE — ED Triage Notes (Signed)
Patient here for follow up from finger lac. Reports she noticed swelling immediately after getting stiches placed. Reports that the redness and pain started about two days ago.

## 2020-03-26 NOTE — ED Provider Notes (Signed)
[name]    CSN: [id number] Arrival date & time: 03/26/20  1310      History   Chief Complaint Chief Complaint  Patient presents with  . Follow-up    HPI Nichole Barber is a 44 y.o. female.   Patient presents with redness and swelling of her left index finger x 2 days.  She received sutures for laceration in this area on 03/21/2020.  She denies numbness, weakness, fever, chills, drainage from the wound, red streaks, or other symptoms.  She has been treating the wound at home with soap and water cleaning twice a day.  The history is provided by the patient.    Past Medical History:  Diagnosis Date  . Asthma     Patient Active Problem List   Diagnosis Date Noted  . Hypochromic microcytic anemia 10/08/2018  . Pain in right knee 05/09/2018  . Pain in both hands 07/05/2016  . General medical exam 07/15/2015  . Abnormal uterine bleeding 02/13/2013  . Displacement of lumbar intervertebral disc 05/02/2011  . Low back pain 05/02/2011    Past Surgical History:  Procedure Laterality Date  . CARPAL TUNNEL RELEASE    . CESAREAN SECTION    . KNEE SURGERY      OB History   No obstetric history on file.      Home Medications    Prior to Admission medications   Medication Sig Start Date End Date Taking? Authorizing Provider  albuterol (VENTOLIN HFA) 108 (90 Base) MCG/ACT inhaler Frequency:PHARMDIR   Dosage:90   MCG  Instructions:  Note:used inhaler as needed daily for asthma attacks Dose: 90 MCG 08/08/12   [provider]  beclomethasone (QVAR) 40 MCG/ACT inhaler Frequency:PHARMDIR   Dosage:40   MCG  Instructions:  Note:use q var inhaler twice daily; if dose is lower than home dose please provide 80 mcg inhaler Dose: 40 MCG 08/08/12   [provider]  Cetirizine HCl 10 MG CAPS 10 mg. 11/22/11   [provider]  doxycycline (VIBRAMYCIN) 100 MG capsule Take 1 capsule (100 mg total) by mouth 2 (two) times daily for 7 days. 03/26/20 04/02/20   Sharion Balloon, NP  ferrous sulfate (EQL SLOW RELEASE IRON) 160 (50 Fe) MG TBCR SR tablet Take 1 tablet (160 mg total) by mouth daily. 02/22/17   Earleen Newport, MD  Fluocinolone Acetonide Body 0.01 % OIL 0.01 %. 09/29/13   [provider]  fluticasone (FLONASE) 50 MCG/ACT nasal spray FLUTICASONE PROPIONATE 50 MCG/ACT SUSP 08/04/13   [provider]  ibuprofen (ADVIL,MOTRIN) 800 MG tablet Take 1 tablet (800 mg total) by mouth every 8 (eight) hours as needed. Patient not taking: Reported on 02/22/2017 10/05/15   Arlyss Repress, PA-C  montelukast (SINGULAIR) 10 MG tablet 10 mg. 11/10/14   [provider]  mupirocin cream (BACTROBAN) 2 % 2 %. 06/18/15   [provider]  naproxen (NAPROSYN) 375 MG tablet Take 1 tablet (375 mg total) by mouth 2 (two) times daily with a meal. 03/21/20   Jaynee Eagles, PA-C  omeprazole (PRILOSEC) 40 MG capsule 40 mg. 11/22/11   [provider]  Respiratory Therapy Supplies (PARI BABY CONVERSION KIT) [place] NEBULIZER 06/29/11   [provider]  SUMAtriptan (IMITREX) 25 MG tablet 25 mg. 09/17/13   [provider]    Family History History reviewed. No pertinent family history.  Social History Social History   Tobacco Use  . Smoking status: Current Every Day Smoker  . Smokeless tobacco: Never  Used  Substance Use Topics  . Alcohol use: No  . Drug use: No     Allergies   Amoxicillin-pot clavulanate, Feraheme [ferumoxytol], Metronidazole, and Pineapple   Review of Systems Review of Systems  Constitutional: Negative for chills and fever.  HENT: Negative for ear pain and sore throat.   Eyes: Negative for pain and visual disturbance.  Respiratory: Negative for cough and shortness of breath.   Cardiovascular: Negative for chest pain and palpitations.  Gastrointestinal: Negative for abdominal pain and vomiting.  Genitourinary: Negative for dysuria and hematuria.  Musculoskeletal: Negative for arthralgias  and back pain.  Skin: Positive for color change and wound.  Neurological: Negative for seizures, syncope, weakness and numbness.  All other systems reviewed and are negative.    Physical Exam Triage Vital Signs ED Triage Vitals  Enc Vitals Group     BP 03/26/20 1429 (!) 149/77     Pulse Rate 03/26/20 1429 87     Resp 03/26/20 1429 16     Temp 03/26/20 1429 98.9 F (37.2 C)     Temp src --      SpO2 03/26/20 1429 100 %     Weight --      Height --      Head Circumference --      Peak Flow --      Pain Score 03/26/20 1428 6     Pain Loc --      Pain Edu? --      Excl. in [place]? --    No data found.  Updated Vital Signs BP (!) 149/77   Pulse 87   Temp 98.9 F (37.2 C)   Resp 16   LMP 03/19/2020 (Within Days)   SpO2 100%   Visual Acuity Right Eye Distance:   Left Eye Distance:   Bilateral Distance:    Right Eye Near:   Left Eye Near:    Bilateral Near:     Physical Exam Vitals and nursing note reviewed.  Constitutional:      General: She is not in acute distress.    Appearance: She is well-developed.  HENT:     Head: Normocephalic and atraumatic.     Mouth/Throat:     Mouth: Mucous membranes are moist.  Eyes:     Conjunctiva/sclera: Conjunctivae normal.  Cardiovascular:     Rate and Rhythm: Normal rate and regular rhythm.     Heart sounds: No murmur heard.   Pulmonary:     Effort: Pulmonary effort is normal. No respiratory distress.     Breath sounds: Normal breath sounds.  Abdominal:     Palpations: Abdomen is soft.     Tenderness: There is no abdominal tenderness.  Musculoskeletal:        General: Normal range of motion.     Cervical back: Neck supple.  Skin:    General: Skin is warm and dry.     Findings: Lesion present.     Comments: Laceration with sutures on left index finger; wound edges approximated; no drainage; mild erythema and mild edema at tip of finger just beside suture line.  Neurological:     General: No focal deficit present.      Mental Status: She is alert and oriented to person, place, and time.     Sensory: No sensory deficit.     Motor: No weakness.     Gait: Gait normal.  Psychiatric:        Mood and Affect: Mood normal.  Behavior: Behavior normal.      UC Treatments / Results  Labs (all labs ordered are listed, but only abnormal results are displayed) Labs Reviewed - No data to display  EKG   Radiology No results found.  Procedures Procedures (including critical care time)  Medications Ordered in UC Medications - No data to display  Initial Impression / Assessment and Plan / UC Course  I have reviewed the triage vital signs and the nursing notes.  Pertinent labs & imaging results that were available during my care of the patient were reviewed by me and considered in my medical decision making (see chart for details).   Very mild cellulitis of the left index finger.  Treating with doxycycline (patient has allergy to penicillin).  Wound care instructions and signs of infection discussed with patient.  Instructed her to return here for suture removal as scheduled.  Instructed her to return sooner if she notes signs of worsening infection.  Patient agrees to plan of care.  Final Clinical Impressions(s) / UC Diagnoses   Final diagnoses:  Cellulitis of left index finger     Discharge Instructions     Take the antibiotic as directed.  Keep your wound clean and dry.  Wash it gently twice a day with soap and water.    Your stitches need to be taken out 7-10 days from when they were put in.   Return here if you see signs of infection, such as increased pain, redness, pus-like drainage, warmth, fever, chills, or other concerning symptoms.       ED Prescriptions    Medication Sig Dispense Auth. Provider   doxycycline (VIBRAMYCIN) 100 MG capsule Take 1 capsule (100 mg total) by mouth 2 (two) times daily for 7 days. 14 capsule Sharion Balloon, NP     PDMP not reviewed this encounter.     Sharion Balloon, NP 03/26/20 [phone]

## 2020-03-26 NOTE — ED Notes (Signed)
I applied dry dressing to pt's wound.

## 2020-03-31 ENCOUNTER — Other Ambulatory Visit: Payer: Self-pay

## 2020-03-31 ENCOUNTER — Encounter (HOSPITAL_COMMUNITY): Payer: Self-pay | Admitting: *Deleted

## 2020-03-31 ENCOUNTER — Ambulatory Visit (HOSPITAL_COMMUNITY)
Admission: EM | Admit: 2020-03-31 | Discharge: 2020-03-31 | Disposition: A | Discharge status: Home / Self Care | Payer: Medicaid Other

## 2020-03-31 DIAGNOSIS — Z4802 Encounter for removal of sutures: Secondary | ICD-10-CM

## 2020-03-31 NOTE — ED Notes (Signed)
Called x 2 no answer

## 2020-03-31 NOTE — ED Triage Notes (Signed)
Patient here for suture removal, placed 10 days. Came back Saturday for antibiotics--last day will be tomorrow.   Patient sore to area. Provider, Bess Harvest PA, to review and talk with patient.   No signs of infection.

## 2020-03-31 NOTE — ED Notes (Addendum)
4 sutures removed. No dehiscing.

## 2020-04-19 ENCOUNTER — Inpatient Hospital Stay: Payer: Medicaid Other

## 2020-04-19 ENCOUNTER — Inpatient Hospital Stay: Payer: Medicaid Other | Admitting: Hematology and Oncology

## 2020-04-27 ENCOUNTER — Telehealth: Payer: Self-pay | Admitting: Hematology and Oncology

## 2020-04-27 NOTE — Telephone Encounter (Signed)
Called pt per 9/29 sch msg - no answer.left message for patient to call back to reschedule appt.

## 2020-04-28 ENCOUNTER — Other Ambulatory Visit: Payer: Self-pay | Admitting: *Deleted

## 2020-04-28 DIAGNOSIS — D509 Iron deficiency anemia, unspecified: Secondary | ICD-10-CM

## 2020-04-29 ENCOUNTER — Inpatient Hospital Stay: Payer: Medicaid Other | Attending: Hematology and Oncology

## 2020-04-29 ENCOUNTER — Inpatient Hospital Stay: Payer: Medicaid Other | Admitting: Hematology and Oncology

## 2020-08-06 ENCOUNTER — Other Ambulatory Visit: Payer: Self-pay

## 2020-09-12 ENCOUNTER — Encounter (HOSPITAL_COMMUNITY): Payer: Self-pay | Admitting: *Deleted

## 2020-09-12 ENCOUNTER — Other Ambulatory Visit: Payer: Self-pay

## 2020-09-12 ENCOUNTER — Ambulatory Visit (HOSPITAL_COMMUNITY)
Admission: EM | Admit: 2020-09-12 | Discharge: 2020-09-12 | Disposition: A | Discharge status: Home / Self Care | Payer: Medicaid Other | Attending: Family Medicine | Admitting: Family Medicine

## 2020-09-12 DIAGNOSIS — M25522 Pain in left elbow: Secondary | ICD-10-CM

## 2020-09-12 DIAGNOSIS — M25532 Pain in left wrist: Secondary | ICD-10-CM

## 2020-09-12 DIAGNOSIS — M25531 Pain in right wrist: Secondary | ICD-10-CM

## 2020-09-12 DIAGNOSIS — M25521 Pain in right elbow: Secondary | ICD-10-CM

## 2020-09-12 MED ORDER — PREDNISONE 20 MG PO TABS: 40.0000 mg | ORAL_TABLET | Freq: Every day | ORAL | 0 refills | Status: DC

## 2020-09-12 NOTE — ED Triage Notes (Addendum)
Pt reports she fell during the snow and now has pain in RT arm.

## 2020-09-14 NOTE — ED Provider Notes (Signed)
[place]   [id number] 09/12/20 Arrival Time: 1030  ASSESSMENT & PLAN:  1. Bilateral wrist pain   2. Bilateral elbow joint pain    Placed in bilateral wrist splints. No indication for imaging today.  Begin trial of: Meds ordered this encounter  Medications  . predniSONE (DELTASONE) 20 MG tablet    Sig: Take 2 tablets (40 mg total) by mouth daily.    Dispense:  10 tablet    Refill:  0    Orders Placed This Encounter  Procedures  . Apply Wrist brace    Recommend:  Follow-up Information    Schedule an appointment as soon as possible for a visit  with [place].   Contact information: [address] [place] [phone]              Reviewed expectations re: course of current medical issues. Questions answered. Outlined signs and symptoms indicating need for more acute intervention. Patient verbalized understanding. After Visit Summary given.  SUBJECTIVE: History from: patient. Nichole Barber is a 45 y.o. female who reports bilateral wrist and elbow pain; noted after fall a week or two ago but gradual in onset. Does lift a lot at work; questions relation. No swelling or bruising. No extremity sensation changes or weakness except for occasional tingling in hands. No OTC tx.  Past Surgical History:  Procedure Laterality Date  . CARPAL TUNNEL RELEASE    . CESAREAN SECTION    . KNEE SURGERY        OBJECTIVE:  Vitals:   09/12/20 1144  BP: (!) 141/93  Pulse: 94  Resp: 16  Temp: 98.8 F (37.1 C)  TempSrc: Oral    General appearance: alert; no distress HEENT: ; AT Neck: supple with FROM Resp: unlabored respirations Extremities: . Bilateral UE: warm with well perfused appearance of wrists and elbows; no swelling or bruising; FROM; no bony TTP CV: brisk extremity capillary refill of bilateral UE Skin: warm and dry; no visible rashes Neurologic: gait normal; normal sensation  and strength of bilateral UE Psychological: alert and cooperative; normal mood and affect   Allergies  Allergen Reactions  . Amoxicillin-Pot Clavulanate Hives  . Feraheme [Ferumoxytol] Shortness Of Breath    Tolerated feraheme w/ famotidine and diphenhydramine premedication.  . Metronidazole Hives  . Pineapple Hives    Past Medical History:  Diagnosis Date  . Asthma    Social History   Socioeconomic History  . Marital status: Single    Spouse name: Not on file  . Number of children: Not on file  . Years of education: Not on file  . Highest education level: Not on file  Occupational History  . Not on file  Tobacco Use  . Smoking status: Current Every Day Smoker  . Smokeless tobacco: Never Used  Substance and Sexual Activity  . Alcohol use: No  . Drug use: No  . Sexual activity: Not on file  Other Topics Concern  . Not on file  Social History Narrative   ** Merged History Encounter **       Social Determinants of Health   Financial Resource Strain: Not on file  Food Insecurity: Not on file  Transportation Needs: Not on file  Physical Activity: Not on file  Stress: Not on file  Social Connections: Not on file   History reviewed. No pertinent family history. Past Surgical History:  Procedure Laterality Date  . CARPAL TUNNEL RELEASE    . CESAREAN  SECTION    . KNEE SURGERY        Vanessa Kick, MD 09/14/20 [phone]

## 2020-09-23 ENCOUNTER — Ambulatory Visit (INDEPENDENT_AMBULATORY_CARE_PROVIDER_SITE_OTHER): Payer: Medicaid Other | Admitting: Family Medicine

## 2020-09-23 ENCOUNTER — Other Ambulatory Visit: Payer: Self-pay

## 2020-09-23 ENCOUNTER — Ambulatory Visit
Admission: RE | Admit: 2020-09-23 | Discharge: 2020-09-23 | Disposition: A | Discharge status: Home / Self Care | Payer: Medicaid Other | Source: Ambulatory Visit | Attending: Family Medicine | Admitting: Family Medicine

## 2020-09-23 VITALS — BP 132/94 | Ht 65.5 in | Wt 170.0 lb

## 2020-09-23 DIAGNOSIS — M25521 Pain in right elbow: Secondary | ICD-10-CM

## 2020-09-23 DIAGNOSIS — M25531 Pain in right wrist: Secondary | ICD-10-CM | POA: Diagnosis not present

## 2020-09-23 DIAGNOSIS — M25522 Pain in left elbow: Secondary | ICD-10-CM | POA: Diagnosis not present

## 2020-09-23 DIAGNOSIS — M25532 Pain in left wrist: Secondary | ICD-10-CM | POA: Diagnosis not present

## 2020-09-23 IMAGING — CR DG ELBOW 2V*L*
2 series · 2 of 2 positions shown · non-contrast
Comparison: None.

CLINICAL DATA: Post fall in [REDACTED] with bilateral elbow pain.

EXAM:
LEFT ELBOW - 2 VIEW

[x elbow joint ap left]
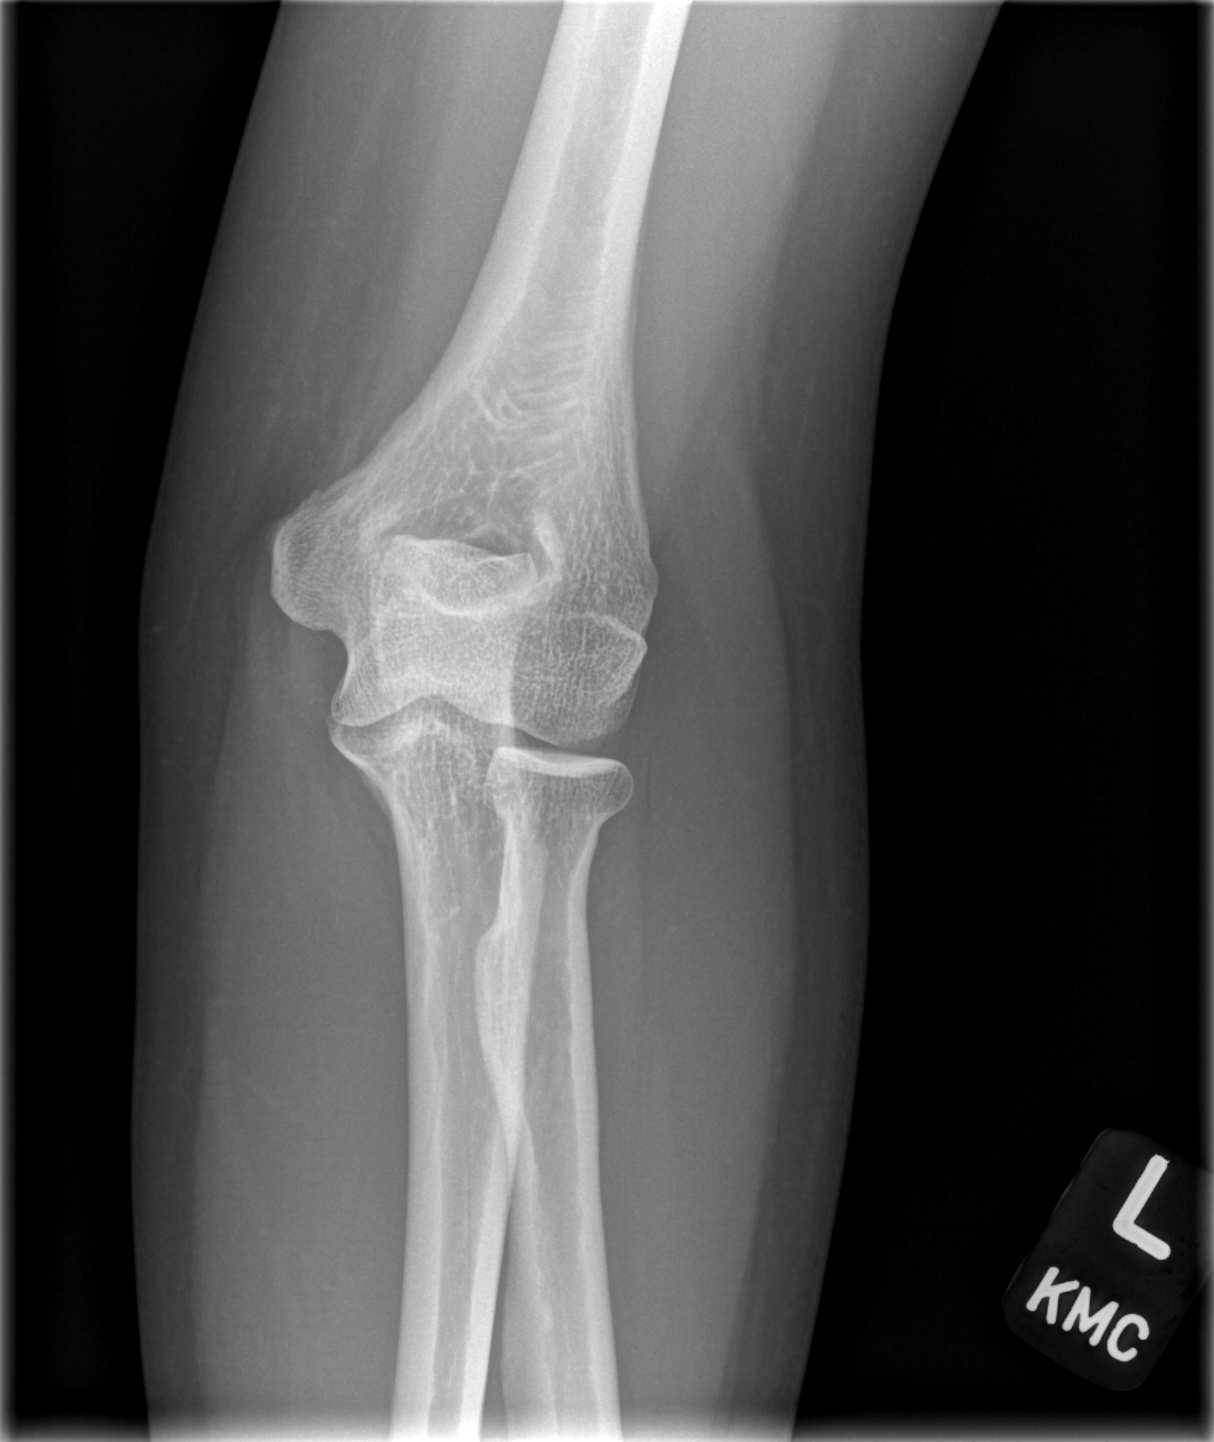

[x elbow joint lat left]
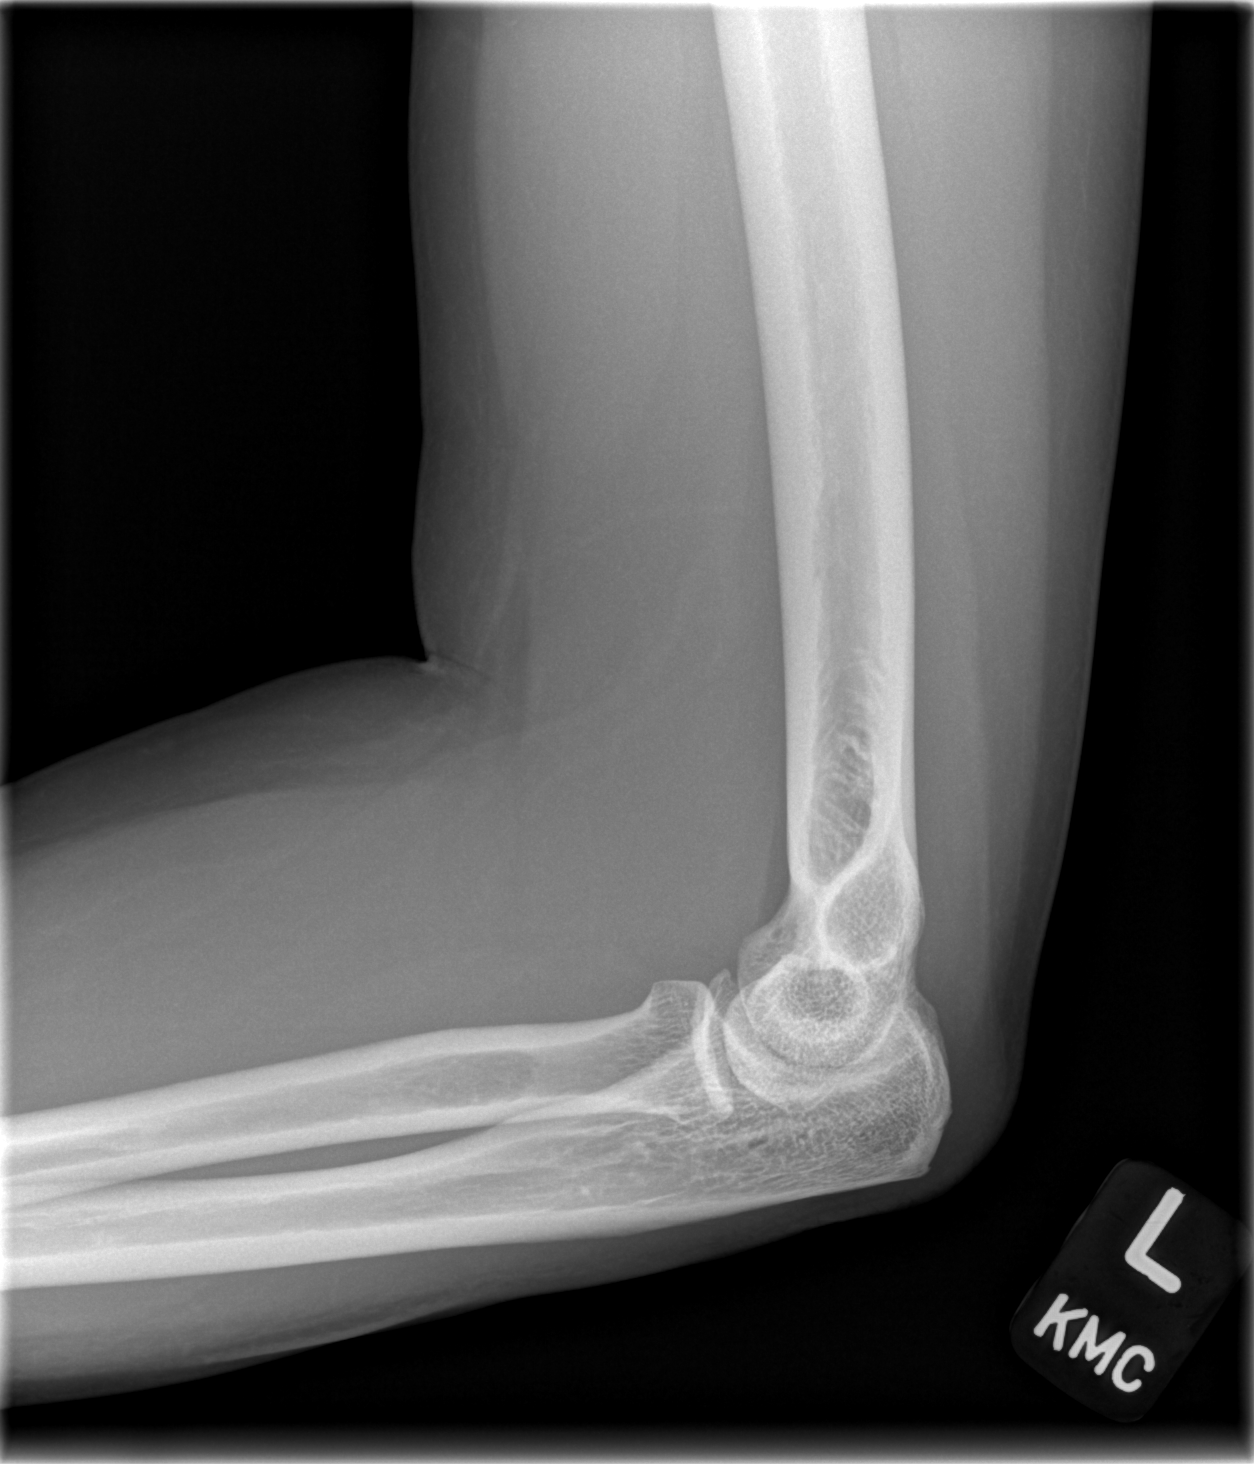

[2 of 2 positions shown; findings below may reference images not displayed]

FINDINGS: No fracture or elbow joint effusion. Joint spaces are preserved.
Regional soft tissues appear normal. No radiopaque foreign body.
IMPRESSION: No explanation for patient's persistent left elbow pain.

## 2020-09-23 IMAGING — CR DG WRIST COMPLETE 3+V*L*
4 series · 4 of 4 positions shown · non-contrast
Comparison: None.

CLINICAL DATA: Left wrist pain since fall in [REDACTED]. Evaluate for
scaphoid fracture

EXAM:
LEFT WRIST - COMPLETE 3+ VIEW

[x wrist pa left]
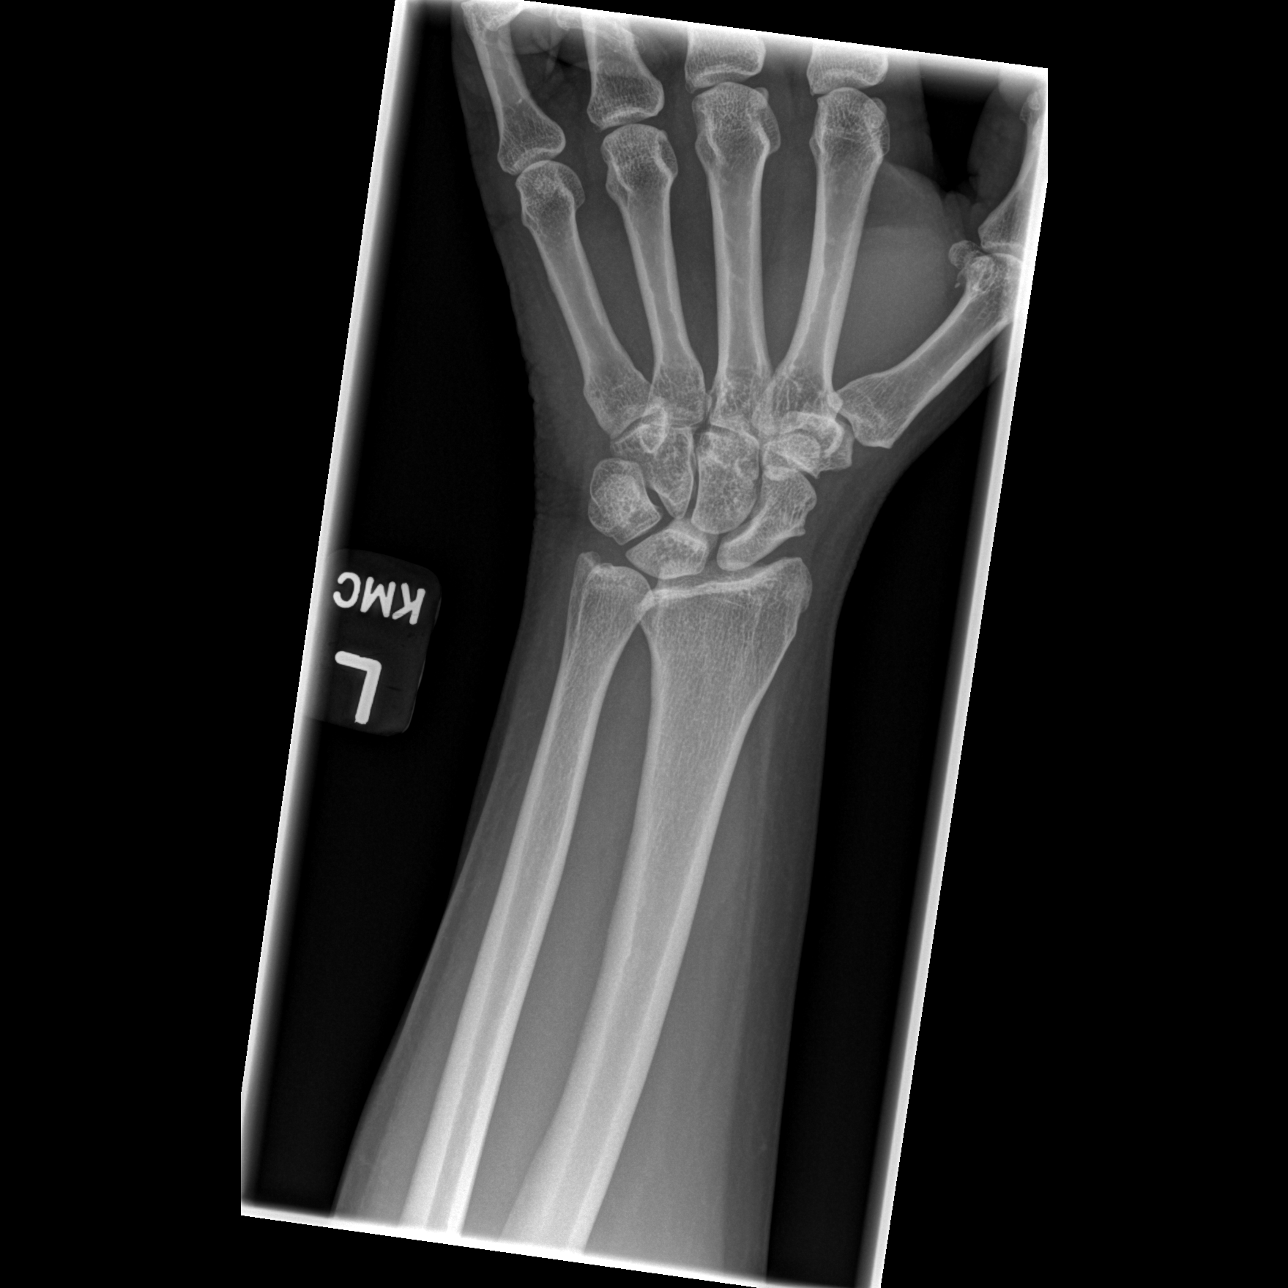

[x wrist obl left]
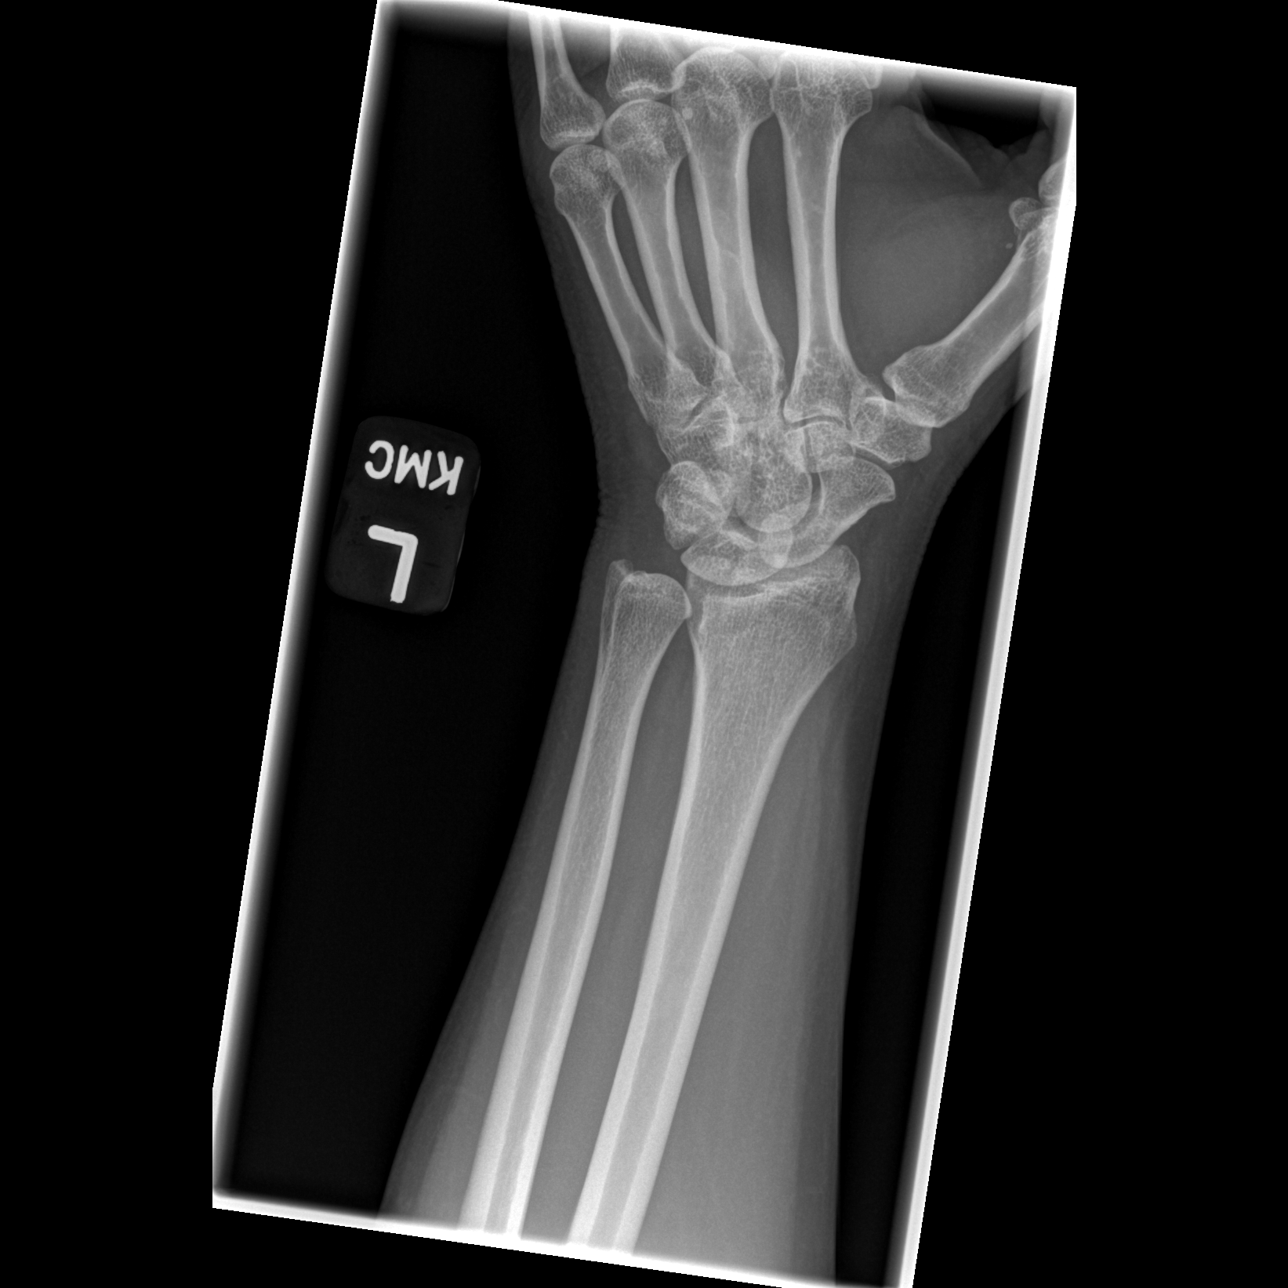

[x wrist lat left]
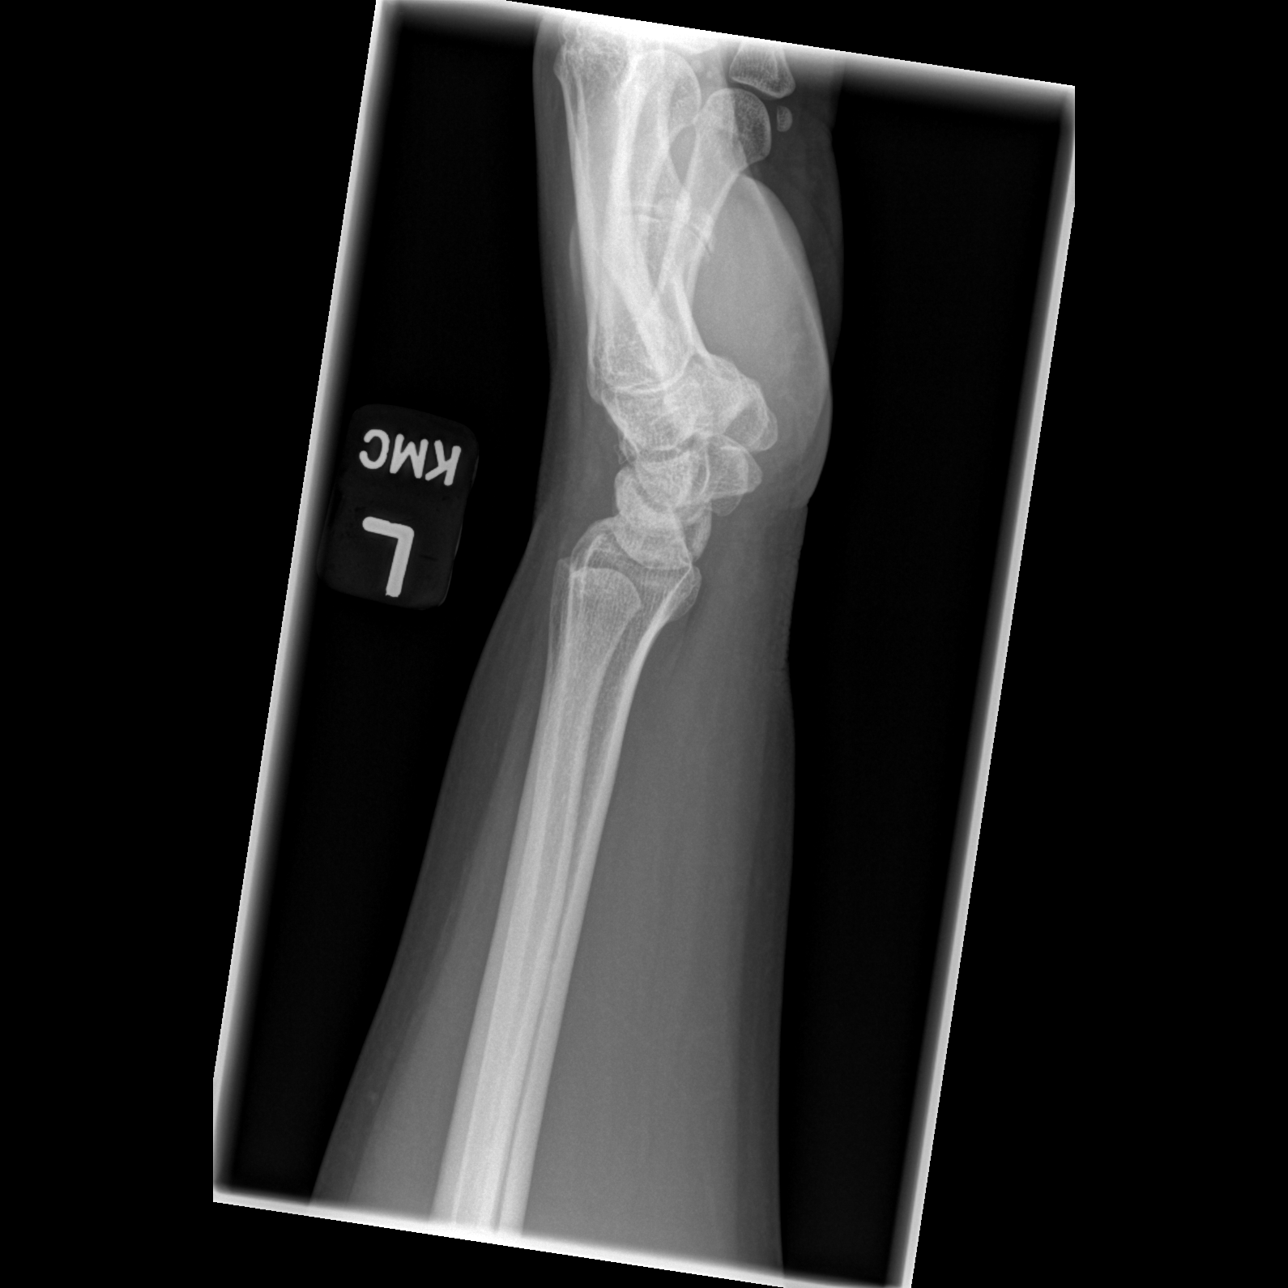

[x navicular]
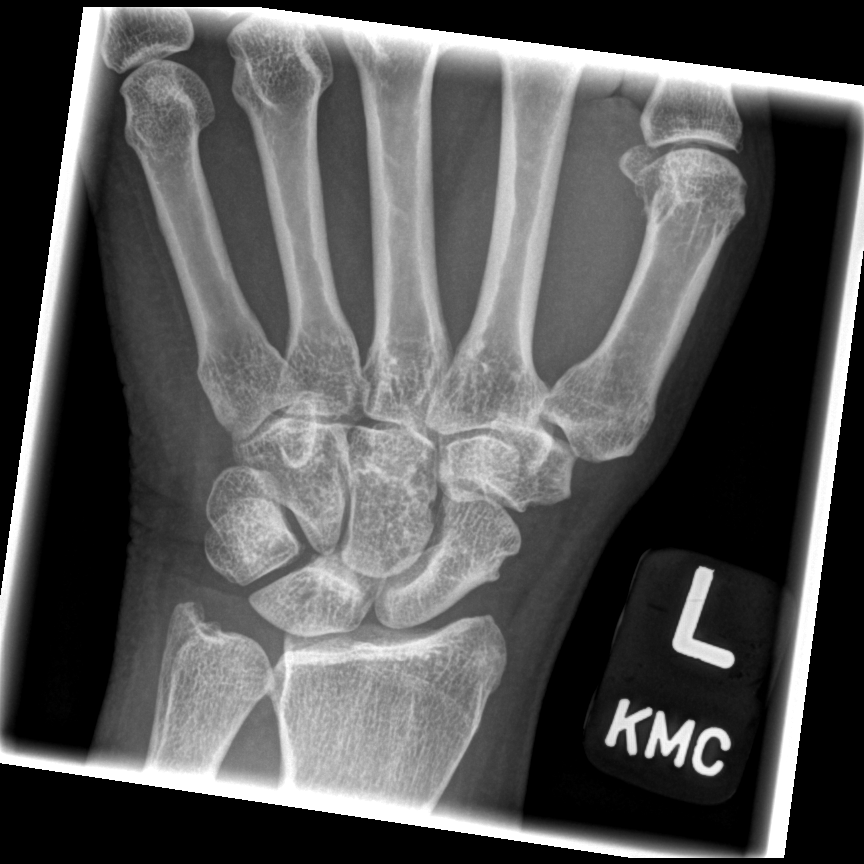

[4 of 4 positions shown; findings below may reference images not displayed]

FINDINGS: No fracture or dislocation with special attention paid to the
scaphoid. Joint spaces are preserved. No erosions. No evidence of
chondrocalcinosis. Regional soft tissues appear normal. No
radiopaque foreign body.
IMPRESSION: No explanation for patient's persistent left wrist pain with special
attention paid to the scaphoid.

## 2020-09-23 IMAGING — CR DG ELBOW 2V*R*
2 series · 2 of 2 positions shown · non-contrast
Comparison: None.

CLINICAL DATA: Bilateral elbow pain since fall in [REDACTED].

EXAM:
RIGHT ELBOW - 2 VIEW

[x elbow joint ap right]
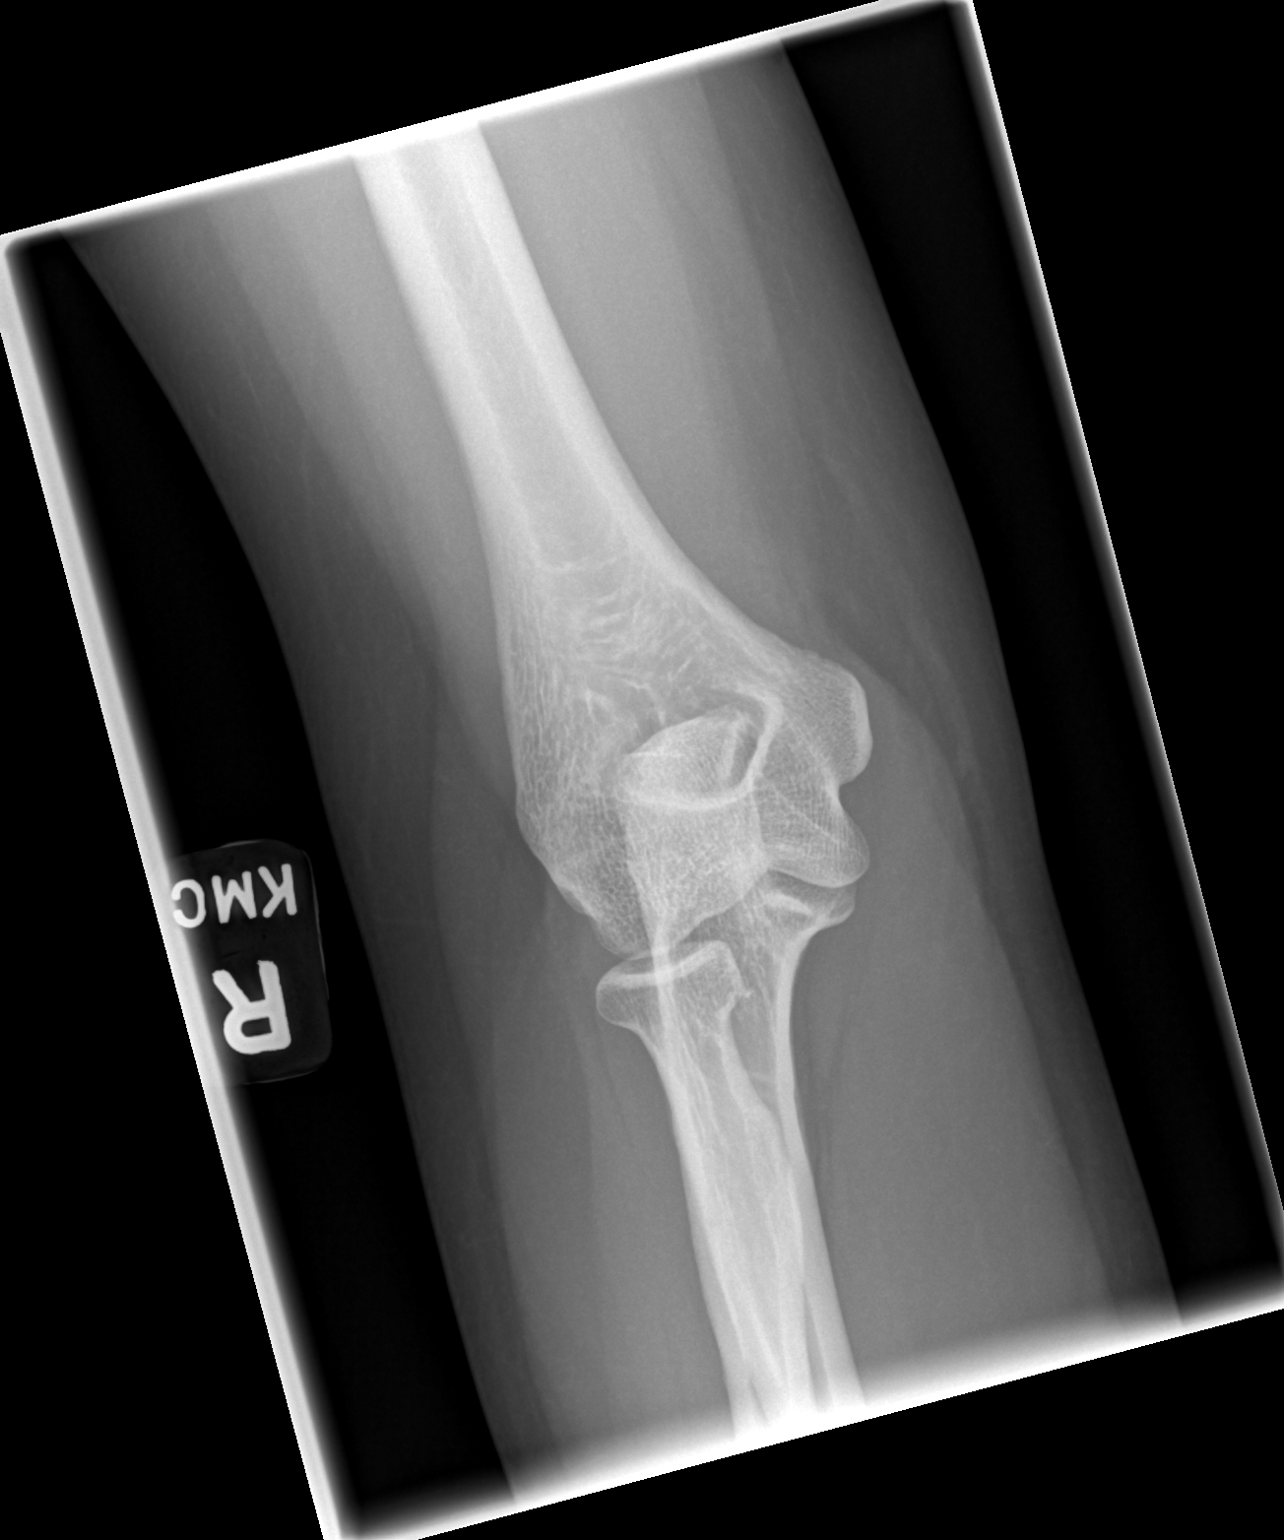

[x elbow joint lat right]
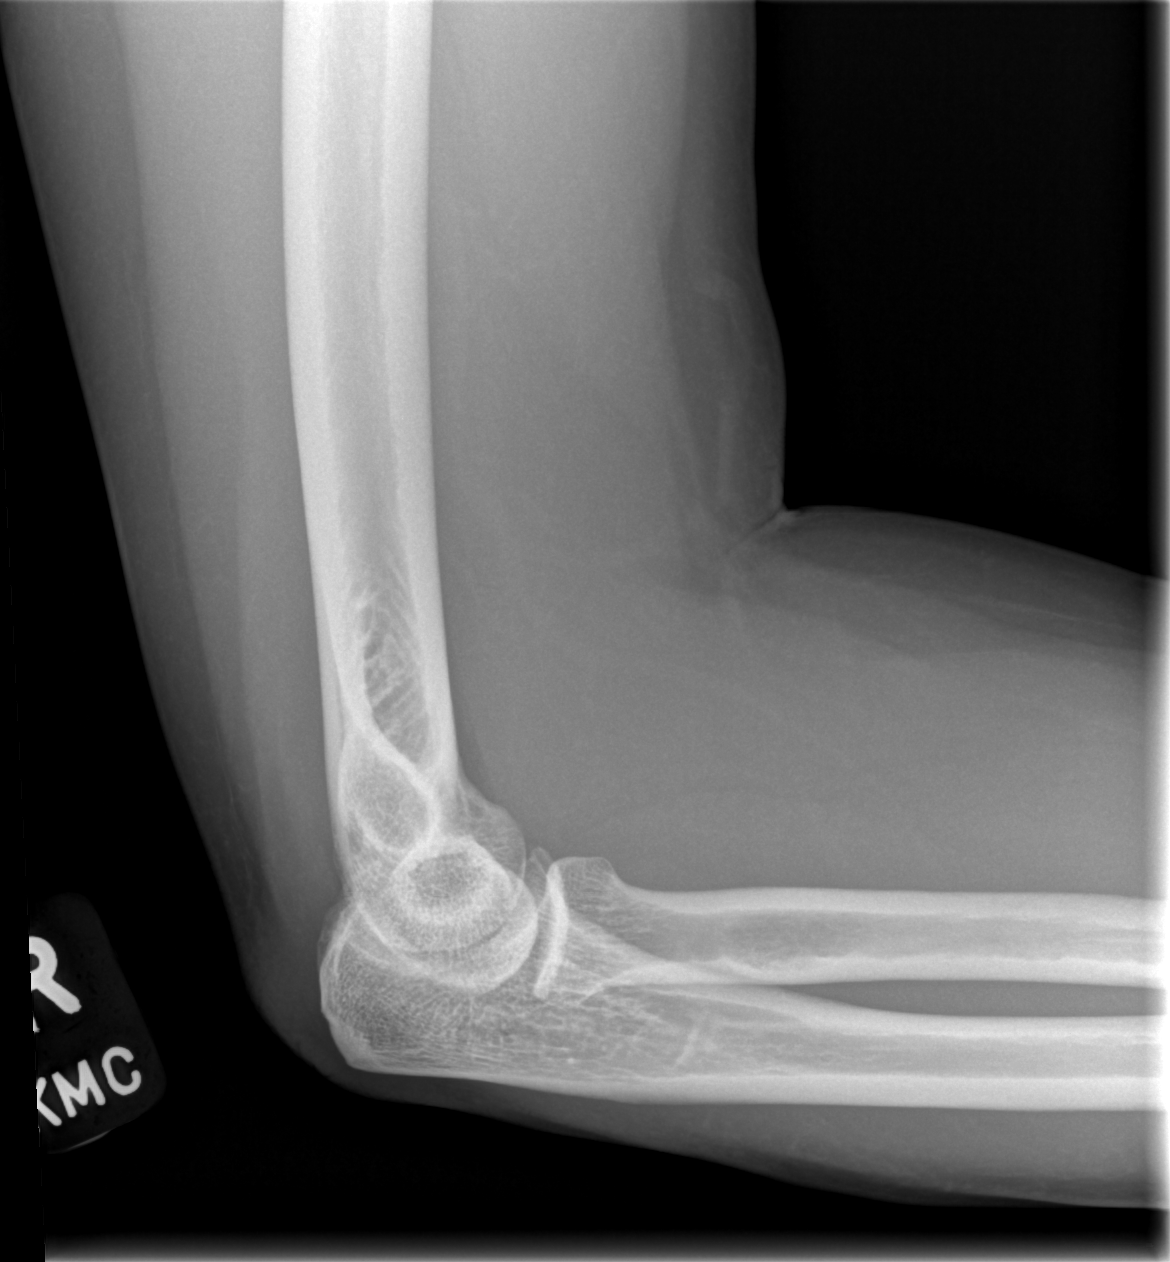

[2 of 2 positions shown; findings below may reference images not displayed]

FINDINGS: No fracture or elbow joint effusion. Joint spaces appear preserved.
Regional soft tissues appear normal. No radiopaque foreign body.
IMPRESSION: No explanation for patient's persistent right elbow pain.

## 2020-09-23 MED ORDER — NITROGLYCERIN 0.2 MG/HR TD PT24: MEDICATED_PATCH | TRANSDERMAL | 1 refills | Status: AC

## 2020-09-23 NOTE — Patient Instructions (Signed)
It was great to meet you today! Thank you for letting me participate in your care!  Today, we discussed your bilateral elbow pain and we will get x-rays today to ensure you have no fracture of those bones. I am also x-raying your wrist to ensure you did not get a fracture there. In terms of pain you can keep taking Naproxen and try Voltaren Gel (it is over the counter). You can also use 1/4 of a nitro patch on the area to help.  Nitroglycerin Protocol   Apply 1/4 nitroglycerin patch to affected area daily.  Change position of patch within the affected area every 24 hours.  You may experience a headache during the first 1-2 weeks of using the patch, these should subside.  If you experience headaches after beginning nitroglycerin patch treatment, you may take your preferred over the counter pain reliever.  Another side effect of the nitroglycerin patch is skin irritation or rash related to patch adhesive.  Please notify our office if you develop more severe headaches or rash, and stop the patch.  Tendon healing with nitroglycerin patch may require 12 to 24 weeks depending on the extent of injury.  Men should not use if taking Viagra, Cialis, or Levitra.   Do not use if you have migraines or rosacea.    Be well, Harolyn Rutherford, DO PGY-4, Sports Medicine Fellow [place]

## 2020-09-23 NOTE — Progress Notes (Signed)
    SUBJECTIVE:   CHIEF COMPLAINT / HPI:   Bilateral elbow and left wrist pain [name] is a new patient to this practice who is a pleasant 45 year old female who presents for bilateral elbow pain and left wrist pain after experiencing a fall in mid January while she slipped on ice.  Since that time she has had bilateral elbow pain that radiates to both of her hands and describes the pain that radiates as a burning pain.  The pain is all over her hands and in no particular distribution.  Also seems to jump around from time to time.  She has felt like she has had weakness in her hands and states that she feels like she has dropped things several times or she has to use 2 hands to carry something that would have previously taken her one hand.  Pain in the elbows located at the back of the elbow and states it hurts to flex and extend both elbows.  She also endorses numbness and tingling over the hands and fingers again in no particular distribution.  PERTINENT  PMH / PSH: History of microcytic anemia  OBJECTIVE:   BP (!) 132/94   Ht 5' 5.5" (1.664 m)   Wt 170 lb (77.1 kg)   LMP 09/05/2020   BMI 27.86 kg/m   No flowsheet data found.  Elbow, Bilateral: Inspection yields no evidence of bony deformity, effusion, erythema, ecchymosis, or rash. Active and passive ROM intact in flexion/extension/supination/pronation. Strength 5/5 throughout. No TTP at the medial or lateral epicondyle. TTP at insertion of triceps tendon on the right. No pain with finger or wrist extension against resistance. No pain with gripping or finger or wrist flexion against resistance. No evidence of pain or laxity at the UCL.  Wrist, Left: Inspection yielded no erythema, ecchymosis, bony deformity, or swelling. ROM full with good flexion and extension and ulnar/radial deviation that is symmetrical with opposite wrist. Palpation is normal over metacarpals, but TTP over scaphoid. No TTP at TFCC; Strength 5/5 in all  directions without pain. Negative Finkelstein, tinel's and phalens.   ASSESSMENT/PLAN:   Bilateral elbow joint pain Given that she has had a fall and is still having pain reasonable to do x-rays at this point.  We will get AP lateral views of both the left and right elbow to rule out any type of fracture.  Given that the right elbow was more painful and her tenderness seemed to be more over the triceps tendon insertion at the olecranon we will start nitro patches for this.  She can also use Voltaren gel.  We will also get x-ray of the left wrist given that she had tenderness to palpation at the scaphoid to rule out scaphoid fracture.  We will follow up with her once we have x-ray results.     Nuala Alpha, DO PGY-4, Sports Medicine Fellow [place]

## 2020-09-24 DIAGNOSIS — M25521 Pain in right elbow: Secondary | ICD-10-CM | POA: Insufficient documentation

## 2020-09-24 DIAGNOSIS — M25522 Pain in left elbow: Secondary | ICD-10-CM | POA: Insufficient documentation

## 2020-09-24 NOTE — Assessment & Plan Note (Signed)
Given that she has had a fall and is still having pain reasonable to do x-rays at this point.  We will get AP lateral views of both the left and right elbow to rule out any type of fracture.  Given that the right elbow was more painful and her tenderness seemed to be more over the triceps tendon insertion at the olecranon we will start nitro patches for this.  She can also use Voltaren gel.  We will also get x-ray of the left wrist given that she had tenderness to palpation at the scaphoid to rule out scaphoid fracture.  We will follow up with her once we have x-ray results.

## 2020-09-25 NOTE — Progress Notes (Signed)
[facility]: Attending Note: I have reviewed the chart, discussed wit the Sports Medicine Fellow. I agree with assessment and treatment plan as detailed in the Fellow's note.  

## 2020-09-27 ENCOUNTER — Encounter: Payer: Self-pay | Admitting: Family Medicine

## 2021-03-09 ENCOUNTER — Encounter: Payer: Self-pay | Admitting: Hematology and Oncology

## 2021-03-09 ENCOUNTER — Other Ambulatory Visit: Payer: Self-pay

## 2021-03-09 ENCOUNTER — Ambulatory Visit (HOSPITAL_COMMUNITY)
Admission: EM | Admit: 2021-03-09 | Discharge: 2021-03-09 | Disposition: A | Discharge status: Home / Self Care | Payer: Medicaid Other | Attending: Emergency Medicine | Admitting: Emergency Medicine

## 2021-03-09 ENCOUNTER — Encounter (HOSPITAL_COMMUNITY): Payer: Self-pay | Admitting: Emergency Medicine

## 2021-03-09 DIAGNOSIS — R1032 Left lower quadrant pain: Secondary | ICD-10-CM | POA: Insufficient documentation

## 2021-03-09 DIAGNOSIS — M545 Low back pain, unspecified: Secondary | ICD-10-CM

## 2021-03-09 DIAGNOSIS — M79604 Pain in right leg: Secondary | ICD-10-CM

## 2021-03-09 LAB — POCT URINALYSIS DIPSTICK, ED / UC
Bilirubin Urine: NEGATIVE
Glucose, UA: NEGATIVE mg/dL
Hgb urine dipstick: NEGATIVE
Ketones, ur: NEGATIVE mg/dL
Nitrite: NEGATIVE
Protein, ur: NEGATIVE mg/dL
Specific Gravity, Urine: >=1.03 (ref 1.005–1.030)
Urobilinogen, UA: 0.2 mg/dL (ref 0.0–1.0)
pH: 6.5 (ref 5.0–8.0)

## 2021-03-09 MED ORDER — PREDNISONE 20 MG PO TABS: 40.0000 mg | ORAL_TABLET | Freq: Every day | ORAL | 0 refills | Status: DC

## 2021-03-09 MED ORDER — LACTULOSE 10 GM/15ML PO SOLN
30.0000 g | Freq: Every day | ORAL | 0 refills | Status: DC
Start: 2021-03-09 — End: 2023-05-22

## 2021-03-09 NOTE — Discharge Instructions (Addendum)
The cause of your symptoms are not known at this time therefore we will move forward treating the symptoms to try to give you some relief  Your urinalysis today showed a small amount of Zarif Rathje blood cells, it has been sent to the lab to see if it grows any bacteria, if this occurs you will be called and antibiotics will be sent in for treatment  Your vaginal swab checking for yeast and bacterial vaginosis will take 2 days to result, you will be notified if positive for provider and treatment will be sent into the pharmacy  I have ordered for you lactulose to see if having a good bowel movement will relieve your symptoms take 1 dose of lactulose when you get home if you have a bowel movement do not take any more medicine, if you do not have a bowel movement may take a second dose, while using medication increase your fluid intake so that you do not become dehydrated  To treat the muscular pain in the pain in your leg I will have you take prednisone each morning for the next 5 days once she completes the prednisone you may use the naproxen that you already have at home  If symptoms persist she can follow-up with your primary care doctor if your back and leg symptoms persist you may follow-up with orthopedic specialist for evaluation

## 2021-03-09 NOTE — ED Provider Notes (Signed)
[facility]    CSN: [id number] Arrival date & time: 03/09/21  1350      History   Chief Complaint Chief Complaint  Patient presents with   Abdominal Pain   Leg Pain    HPI Nichole Barber is a 45 y.o. female.   Patient presents with left lower abdominal pain, left flank pain and left lower back pain beginning 3 days ago.  She has not had a bowel movement in 5 days, took a laxative yesterday and was able to drain a small amount.  Endorses increased gas production.  denies fever, chills, body aches, nausea, vomiting, diarrhea, urinary frequency, urgency, discharge, itching, odor, vaginal irritation.  Concern with right leg discomfort, mainly in the upper thigh. pain felt when the leg is extended and unable to cross her leg.  Denies numbness and tingling.  Pain does not radiate.  History of displacement of lumbar vertebral disc and chronic low back pain.  Past Medical History:  Diagnosis Date   Asthma     Patient Active Problem List   Diagnosis Date Noted   Bilateral elbow joint pain 09/24/2020   Hypochromic microcytic anemia 10/08/2018   Pain in right knee 05/09/2018   Pain in both hands 07/05/2016   General medical exam 07/15/2015   Abnormal uterine bleeding 02/13/2013   Displacement of lumbar intervertebral disc 05/02/2011   Low back pain 05/02/2011    Past Surgical History:  Procedure Laterality Date   CARPAL TUNNEL RELEASE     CESAREAN SECTION     HERNIA REPAIR     KNEE SURGERY      OB History   No obstetric history on file.      Home Medications    Prior to Admission medications   Medication Sig Start Date End Date Taking? Authorizing Provider  Cetirizine HCl 10 MG CAPS 10 mg. 11/22/11  Yes [provider]  ferrous sulfate (EQL SLOW RELEASE IRON) 160 (50 Fe) MG TBCR SR tablet Take 1 tablet (160 mg total) by mouth daily. 02/22/17  Yes Earleen Newport, MD  lactulose (CHRONULAC) 10 GM/15ML solution Take 45 mLs (30 g total) by mouth  daily. 03/09/21  Yes Sergey Ishler R, NP  montelukast (SINGULAIR) 10 MG tablet 10 mg. 11/10/14  Yes [provider]  omeprazole (PRILOSEC) 40 MG capsule 40 mg. 11/22/11  Yes [provider]  predniSONE (DELTASONE) 20 MG tablet Take 2 tablets (40 mg total) by mouth daily. 03/09/21  Yes Ranie Chinchilla, Leitha Schuller, NP  albuterol (VENTOLIN HFA) 108 (90 Base) MCG/ACT inhaler Frequency:PHARMDIR   Dosage:90   MCG  Instructions:  Note:used inhaler as needed daily for asthma attacks Dose: 90 MCG 08/08/12   [provider]  beclomethasone (QVAR) 40 MCG/ACT inhaler Frequency:PHARMDIR   Dosage:40   MCG  Instructions:  Note:use q var inhaler twice daily; if dose is lower than home dose please provide 80 mcg inhaler Dose: 40 MCG 08/08/12   [provider]  Fluocinolone Acetonide Body 0.01 % OIL 0.01 %. 09/29/13   [provider]  fluticasone (FLONASE) 50 MCG/ACT nasal spray FLUTICASONE PROPIONATE 50 MCG/ACT SUSP 08/04/13   [provider]  ibuprofen (ADVIL,MOTRIN) 800 MG tablet Take 1 tablet (800 mg total) by mouth every 8 (eight) hours as needed. Patient not taking: Reported on 02/22/2017 10/05/15   Arlyss Repress, PA-C  mupirocin cream (BACTROBAN) 2 % 2 %. 06/18/15   [provider]  naproxen (NAPROSYN) 375 MG tablet Take 1 tablet (375 mg total) by  mouth 2 (two) times daily with a meal. 03/21/20   Jaynee Eagles, PA-C  nitroGLYCERIN (NITRODUR - DOSED IN MG/24 HR) 0.2 mg/hr patch Use 1/4 patch daily to the right elbow. 09/23/20   Dickie La, MD  Respiratory Therapy Supplies (PARI BABY CONVERSION KIT) [place] NEBULIZER 06/29/11   [provider]  SUMAtriptan (IMITREX) 25 MG tablet 25 mg. 09/17/13   [provider]    Family History No family history on file.  Social History Social History   Tobacco Use   Smoking status: Every Day    Types: Cigarettes   Smokeless tobacco: Never  Vaping Use   Vaping Use: Never used  Substance Use Topics   Alcohol  use: No   Drug use: No     Allergies   Amoxicillin-pot clavulanate, Feraheme [ferumoxytol], Metronidazole, and Pineapple   Review of Systems Review of Systems Defer to HPI   Physical Exam Triage Vital Signs ED Triage Vitals  Enc Vitals Group     BP 03/09/21 1446 (!) 139/93     Pulse Rate 03/09/21 1446 94     Resp 03/09/21 1446 18     Temp 03/09/21 1446 98 F (36.7 C)     Temp Source 03/09/21 1446 Oral     SpO2 03/09/21 1446 96 %     Weight --      Height --      Head Circumference --      Peak Flow --      Pain Score 03/09/21 1441 5     Pain Loc --      Pain Edu? --      Excl. in [place]? --    No data found.  Updated Vital Signs BP (!) 139/93 (BP Location: Left Arm)   Pulse 94   Temp 98 F (36.7 C) (Oral)   Resp 18   LMP 02/10/2021   SpO2 96%   Visual Acuity Right Eye Distance:   Left Eye Distance:   Bilateral Distance:    Right Eye Near:   Left Eye Near:    Bilateral Near:     Physical Exam Constitutional:      Appearance: She is well-developed and normal weight.  HENT:     Head: Normocephalic.  Eyes:     Extraocular Movements: Extraocular movements intact.  Pulmonary:     Effort: Pulmonary effort is normal.  Abdominal:     General: Abdomen is flat. Bowel sounds are normal.     Palpations: Abdomen is soft.     Tenderness: There is abdominal tenderness in the left lower quadrant. There is left CVA tenderness. There is no right CVA tenderness or guarding.  Musculoskeletal:     Cervical back: Normal.     Thoracic back: Normal.     Lumbar back: No swelling or spasms. Normal range of motion.       Back:     Comments: Able to abduct right leg 10 to 15% before pain is elicited, remaining range of motion intact, no point tenderness over the knee, no swelling, ecchymosis, or effusion noted, diffuse tenderness throughout the anterior right thigh, bilateral tenderness across the lower latissimus dorsi, no point tenderness noted, no deformity swelling noted   Skin:    General: Skin is warm and dry.  Neurological:     Mental Status: She is alert and oriented to person, place, and time.  Psychiatric:        Mood and Affect: Mood normal.  Behavior: Behavior normal.     UC Treatments / Results  Labs (all labs ordered are listed, but only abnormal results are displayed) Labs Reviewed  POCT URINALYSIS DIPSTICK, ED / UC - Abnormal; Notable for the following components:      Result Value   Leukocytes,Ua TRACE (*)    All other components within normal limits  URINE CULTURE  CERVICOVAGINAL ANCILLARY ONLY    EKG   Radiology No results found.  Procedures Procedures (including critical care time)  Medications Ordered in UC Medications - No data to display  Initial Impression / Assessment and Plan / UC Course  I have reviewed the triage vital signs and the nursing notes.  Pertinent labs & imaging results that were available during my care of the patient were reviewed by me and considered in my medical decision making (see chart for details).  Left lower quadrant abdominal pain Acute bilateral low back pain without sciatica Acute pain of right leg  Etiology of urinary symptoms unclear at this time, discussed with patient, therefore we can forward with treatment of symptoms while attempting to rule out overt causes  1.  Urinalysis positive for leukocytes, will send for culture 2.  STI screening for yeast and bacterial vaginosis, patient not currently sexually active and has not been for extended period of time will defer remaining STI infections 3.  Lactulose 30 mg daily.  Until bowel movement occurs, discontinue medication once bowel movement occurs, increase fluid intake to prevent dehydration while using medication 4.  Prednisone 20 mg daily for 5 days, after completion of medication can use prescribed naproxen which you already have at home providing management, if symptoms persist for reoccurring please follow-up with  orthopedics as well as her primary care provider for further evaluation Final Clinical Impressions(s) / UC Diagnoses   Final diagnoses:  Left lower quadrant abdominal pain  Acute bilateral low back pain without sciatica  Acute leg pain, right     Discharge Instructions      The cause of your symptoms are not known at this time therefore we will move forward treating the symptoms to try to give you some relief  Your urinalysis today showed a small amount of [place] Marcucci blood cells, it has been sent to the lab to see if it grows any bacteria, if this occurs you will be called and antibiotics will be sent in for treatment  Your vaginal swab checking for yeast and bacterial vaginosis will take 2 days to result, you will be notified if positive for provider and treatment will be sent into the pharmacy  I have ordered for you lactulose to see if having a good bowel movement will relieve your symptoms take 1 dose of lactulose when you get home if you have a bowel movement do not take any more medicine, if you do not have a bowel movement may take a second dose, while using medication increase your fluid intake so that you do not become dehydrated  To treat the muscular pain in the pain in your leg I will have you take prednisone each morning for the next 5 days once she completes the prednisone you may use the naproxen that you already have at home  If symptoms persist she can follow-up with your primary care doctor if your back and leg symptoms persist you may follow-up with orthopedic specialist for evaluation   ED Prescriptions     Medication Sig Dispense Auth. Provider   lactulose (CHRONULAC) 10 GM/15ML solution Take 45 mLs (  30 g total) by mouth daily. 140 mL Estalee Mccandlish R, NP   predniSONE (DELTASONE) 20 MG tablet Take 2 tablets (40 mg total) by mouth daily. 10 tablet Hans Eden, NP      PDMP not reviewed this encounter.   Hans Eden, NP 03/09/21 1601

## 2021-03-09 NOTE — ED Triage Notes (Signed)
Left lower abdomen, left flank, and left lower back pain,  right leg is painful when attempting to lift right leg.  Painful areas started 3 days ago.  No known injury.    Last bm was yesterday-took a laxative  prior to [facility] Denies urinary symptoms Denies vaginal discharge

## 2021-03-10 LAB — CERVICOVAGINAL ANCILLARY ONLY
Bacterial Vaginitis (gardnerella): NEGATIVE
Candida Glabrata: NEGATIVE
Candida Vaginitis: NEGATIVE
Comment: NEGATIVE
Comment: NEGATIVE
Comment: NEGATIVE

## 2021-03-11 LAB — URINE CULTURE

## 2021-03-14 ENCOUNTER — Telehealth (HOSPITAL_COMMUNITY): Payer: Self-pay

## 2021-03-14 NOTE — Telephone Encounter (Signed)
Patient called for lab results, results for BV and Candida vaginitis given. All patient questions were answered.

## 2021-04-26 ENCOUNTER — Encounter (HOSPITAL_COMMUNITY): Payer: Self-pay

## 2021-04-26 ENCOUNTER — Other Ambulatory Visit: Payer: Self-pay

## 2021-04-26 ENCOUNTER — Ambulatory Visit (HOSPITAL_COMMUNITY)
Admission: EM | Admit: 2021-04-26 | Discharge: 2021-04-26 | Disposition: A | Discharge status: Home / Self Care | Payer: Medicaid Other | Attending: Internal Medicine | Admitting: Internal Medicine

## 2021-04-26 DIAGNOSIS — U071 COVID-19: Secondary | ICD-10-CM | POA: Diagnosis not present

## 2021-04-26 DIAGNOSIS — Z0189 Encounter for other specified special examinations: Secondary | ICD-10-CM

## 2021-04-26 DIAGNOSIS — Z20822 Contact with and (suspected) exposure to covid-19: Secondary | ICD-10-CM | POA: Diagnosis present

## 2021-04-26 NOTE — ED Triage Notes (Signed)
Pt presents for COVID testing. Pt denies fever, shortness of breath, cough, or any other symptoms.    

## 2021-04-27 LAB — SARS CORONAVIRUS 2 (TAT 6-24 HRS): SARS Coronavirus 2: POSITIVE — AB

## 2021-05-02 ENCOUNTER — Other Ambulatory Visit: Payer: Self-pay

## 2021-05-02 ENCOUNTER — Ambulatory Visit (HOSPITAL_COMMUNITY)
Admission: EM | Admit: 2021-05-02 | Discharge: 2021-05-02 | Disposition: A | Discharge status: Home / Self Care | Payer: Medicaid Other | Attending: Student | Admitting: Student

## 2021-05-02 ENCOUNTER — Encounter (HOSPITAL_COMMUNITY): Payer: Self-pay | Admitting: Emergency Medicine

## 2021-05-02 DIAGNOSIS — U071 COVID-19: Secondary | ICD-10-CM

## 2021-05-02 MED ORDER — ONDANSETRON 8 MG PO TBDP: 8.0000 mg | ORAL_TABLET | Freq: Three times a day (TID) | ORAL | 0 refills | Status: DC | PRN

## 2021-05-02 NOTE — ED Provider Notes (Signed)
[facility]    CSN: [id number] Arrival date & time: 05/02/21  1153      History   Chief Complaint Chief Complaint  Patient presents with   Abdominal Pain   Nausea   Chills   Generalized Body Aches    HPI Nichole Barber is a 45 y.o. female presenting with COVID symptoms following testing positive for COVID 9/28.  Medical history asthma, this is currently well controlled on current regimen and denies exacerbation throughout her course of illness.  Patient states that she is feeling somewhat better, but continues to have nausea, crampy abdominal pain, subjective chills, body aches.  Temperature running 99.5 at home.  Over-the-counter medications providing some relief including Tylenol/ibuprofen.  Denies shortness of breath, chest pain, weakness.  HPI  Past Medical History:  Diagnosis Date   Asthma     Patient Active Problem List   Diagnosis Date Noted   Bilateral elbow joint pain 09/24/2020   Hypochromic microcytic anemia 10/08/2018   Pain in right knee 05/09/2018   Pain in both hands 07/05/2016   General medical exam 07/15/2015   Abnormal uterine bleeding 02/13/2013   Displacement of lumbar intervertebral disc 05/02/2011   Low back pain 05/02/2011    Past Surgical History:  Procedure Laterality Date   CARPAL TUNNEL RELEASE     CESAREAN SECTION     HERNIA REPAIR     KNEE SURGERY      OB History   No obstetric history on file.      Home Medications    Prior to Admission medications   Medication Sig Start Date End Date Taking? Authorizing Provider  ondansetron (ZOFRAN ODT) 8 MG disintegrating tablet Take 1 tablet (8 mg total) by mouth every 8 (eight) hours as needed for nausea or vomiting. 05/02/21  Yes Hazel Sams, PA-C  albuterol (VENTOLIN HFA) 108 (90 Base) MCG/ACT inhaler Frequency:PHARMDIR   Dosage:90   MCG  Instructions:  Note:used inhaler as needed daily for asthma attacks Dose: 90 MCG 08/08/12   [provider]  beclomethasone  (QVAR) 40 MCG/ACT inhaler Frequency:PHARMDIR   Dosage:40   MCG  Instructions:  Note:use q var inhaler twice daily; if dose is lower than home dose please provide 80 mcg inhaler Dose: 40 MCG 08/08/12   [provider]  Cetirizine HCl 10 MG CAPS 10 mg. 11/22/11   [provider]  ferrous sulfate (EQL SLOW RELEASE IRON) 160 (50 Fe) MG TBCR SR tablet Take 1 tablet (160 mg total) by mouth daily. 02/22/17   Earleen Newport, MD  Fluocinolone Acetonide Body 0.01 % OIL 0.01 %. 09/29/13   [provider]  fluticasone (FLONASE) 50 MCG/ACT nasal spray FLUTICASONE PROPIONATE 50 MCG/ACT SUSP 08/04/13   [provider]  ibuprofen (ADVIL,MOTRIN) 800 MG tablet Take 1 tablet (800 mg total) by mouth every 8 (eight) hours as needed. Patient not taking: No sig reported 10/05/15   Beers, Pierce Crane, PA-C  lactulose (CHRONULAC) 10 GM/15ML solution Take 45 mLs (30 g total) by mouth daily. 03/09/21   White, Leitha Schuller, NP  montelukast (SINGULAIR) 10 MG tablet 10 mg. 11/10/14   [provider]  mupirocin cream (BACTROBAN) 2 % 2 %. 06/18/15   [provider]  naproxen (NAPROSYN) 375 MG tablet Take 1 tablet (375 mg total) by mouth 2 (two) times daily with a meal. 03/21/20   Jaynee Eagles, PA-C  nitroGLYCERIN (NITRODUR - DOSED IN MG/24 HR) 0.2 mg/hr patch Use 1/4 patch daily to the right elbow. 09/23/20  Dickie La, MD  omeprazole (PRILOSEC) 40 MG capsule 40 mg. 11/22/11   [provider]  predniSONE (DELTASONE) 20 MG tablet Take 2 tablets (40 mg total) by mouth daily. 03/09/21   Hans Eden, NP  Respiratory Therapy Supplies (PARI BABY CONVERSION KIT) [place] NEBULIZER 06/29/11   [provider]  SUMAtriptan (IMITREX) 25 MG tablet 25 mg. 09/17/13   [provider]    Family History History reviewed. No pertinent family history.  Social History Social History   Tobacco Use   Smoking status: Every Day    Types: Cigarettes   Smokeless tobacco: Never   Vaping Use   Vaping Use: Never used  Substance Use Topics   Alcohol use: No   Drug use: No     Allergies   Amoxicillin-pot clavulanate, Feraheme [ferumoxytol], Metronidazole, and Pineapple   Review of Systems Review of Systems  Constitutional:  Negative for appetite change, chills and fever.  HENT:  Positive for congestion. Negative for ear pain, rhinorrhea, sinus pressure, sinus pain and sore throat.   Eyes:  Negative for redness and visual disturbance.  Respiratory:  Positive for cough. Negative for chest tightness, shortness of breath and wheezing.   Cardiovascular:  Negative for chest pain and palpitations.  Gastrointestinal:  Positive for nausea. Negative for abdominal pain, constipation, diarrhea and vomiting.  Genitourinary:  Negative for dysuria, frequency and urgency.  Musculoskeletal:  Negative for myalgias.  Neurological:  Negative for dizziness, weakness and headaches.  Psychiatric/Behavioral:  Negative for confusion.   All other systems reviewed and are negative.   Physical Exam Triage Vital Signs ED Triage Vitals  Enc Vitals Group     BP 05/02/21 1253 (!) 155/97     Pulse Rate 05/02/21 1253 92     Resp 05/02/21 1253 18     Temp 05/02/21 1253 98.4 F (36.9 C)     Temp src --      SpO2 05/02/21 1253 96 %     Weight --      Height --      Head Circumference --      Peak Flow --      Pain Score 05/02/21 1252 5     Pain Loc --      Pain Edu? --      Excl. in [place]? --    No data found.  Updated Vital Signs BP (!) 155/97   Pulse 92   Temp 98.4 F (36.9 C)   Resp 18   SpO2 96%   Visual Acuity Right Eye Distance:   Left Eye Distance:   Bilateral Distance:    Right Eye Near:   Left Eye Near:    Bilateral Near:     Physical Exam Vitals reviewed.  Constitutional:      General: She is not in acute distress.    Appearance: Normal appearance. She is not ill-appearing.  HENT:     Head: Normocephalic and atraumatic.     Right Ear: Tympanic  membrane, ear canal and external ear normal. No tenderness. No middle ear effusion. There is no impacted cerumen. Tympanic membrane is not perforated, erythematous, retracted or bulging.     Left Ear: Tympanic membrane, ear canal and external ear normal. No tenderness.  No middle ear effusion. There is no impacted cerumen. Tympanic membrane is not perforated, erythematous, retracted or bulging.     Nose: Nose normal. No congestion.     Mouth/Throat:     Mouth: Mucous membranes are moist.  Pharynx: Uvula midline. No oropharyngeal exudate or posterior oropharyngeal erythema.  Eyes:     Extraocular Movements: Extraocular movements intact.     Pupils: Pupils are equal, round, and reactive to light.  Cardiovascular:     Rate and Rhythm: Normal rate and regular rhythm.     Heart sounds: Normal heart sounds.  Pulmonary:     Effort: Pulmonary effort is normal.     Breath sounds: Normal breath sounds. No decreased breath sounds, wheezing, rhonchi or rales.  Abdominal:     Palpations: Abdomen is soft.     Tenderness: There is no abdominal tenderness. There is no guarding or rebound.  Neurological:     General: No focal deficit present.     Mental Status: She is alert and oriented to person, place, and time.  Psychiatric:        Mood and Affect: Mood normal.        Behavior: Behavior normal.        Thought Content: Thought content normal.        Judgment: Judgment normal.     UC Treatments / Results  Labs (all labs ordered are listed, but only abnormal results are displayed) Labs Reviewed - No data to display  EKG   Radiology No results found.  Procedures Procedures (including critical care time)  Medications Ordered in UC Medications - No data to display  Initial Impression / Assessment and Plan / UC Course  I have reviewed the triage vital signs and the nursing notes.  Pertinent labs & imaging results that were available during my care of the patient were reviewed by me and  considered in my medical decision making (see chart for details).     This patient is a very pleasant 45 y.o. year old female presenting with COVID-19. Today this pt is afebrile nontachycardic nontachypneic, oxygenating well on room air, no wheezes rhonchi or rales.  This patient tested positive for COVID 9/28, here for a recheck.  She does have a diagnosis of asthma, this is currently well controlled on current regimen.  Zofran sent for symptomatic relief. ED return precautions discussed. Patient verbalizes understanding and agreement.     Final Clinical Impressions(s) / UC Diagnoses   Final diagnoses:  [id number]     Discharge Instructions      -Take the Zofran (ondansetron) up to 3 times daily for nausea and vomiting. Dissolve one pill under your tongue or between your teeth and your cheek. -Drink plenty of fluids -With a virus, you're typically contagious for 5-7 days, or as long as you're having fevers.     ED Prescriptions     Medication Sig Dispense Auth. Provider   ondansetron (ZOFRAN ODT) 8 MG disintegrating tablet Take 1 tablet (8 mg total) by mouth every 8 (eight) hours as needed for nausea or vomiting. 20 tablet Hazel Sams, PA-C      PDMP not reviewed this encounter.   Hazel Sams, PA-C 05/02/21 1348

## 2021-05-02 NOTE — ED Triage Notes (Signed)
Pt is present today with HA, nausea, body aches, chills, and sweats. Pt states sx started Friday

## 2021-05-02 NOTE — Discharge Instructions (Addendum)
-  Take the Zofran (ondansetron) up to 3 times daily for nausea and vomiting. Dissolve one pill under your tongue or between your teeth and your cheek. -Drink plenty of fluids -With a virus, you're typically contagious for 5-7 days, or as long as you're having fevers.

## 2021-05-09 ENCOUNTER — Other Ambulatory Visit: Payer: Self-pay

## 2021-05-09 ENCOUNTER — Encounter (HOSPITAL_COMMUNITY): Payer: Self-pay | Admitting: Emergency Medicine

## 2021-05-09 ENCOUNTER — Ambulatory Visit (HOSPITAL_COMMUNITY)
Admission: EM | Admit: 2021-05-09 | Discharge: 2021-05-09 | Disposition: A | Discharge status: Home / Self Care | Payer: Medicaid Other | Attending: Student | Admitting: Student

## 2021-05-09 DIAGNOSIS — M722 Plantar fascial fibromatosis: Secondary | ICD-10-CM | POA: Diagnosis not present

## 2021-05-09 HISTORY — DX: Anemia, unspecified: D64.9

## 2021-05-09 HISTORY — DX: Bronchitis, not specified as acute or chronic: J40

## 2021-05-09 LAB — COMPREHENSIVE METABOLIC PANEL
ALT: 15 U/L (ref 0–44)
AST: 20 U/L (ref 15–41)
Albumin: 3.6 g/dL (ref 3.5–5.0)
Alkaline Phosphatase: 86 U/L (ref 38–126)
Anion gap: 8 (ref 5–15)
BUN: 5 mg/dL — ABNORMAL LOW (ref 6–20)
CO2: 29 mmol/L (ref 22–32)
Calcium: 9.3 mg/dL (ref 8.9–10.3)
Chloride: 104 mmol/L (ref 98–111)
Creatinine, Ser: 0.82 mg/dL (ref 0.44–1.00)
GFR, Estimated: >60 mL/min (ref >60)
Glucose, Bld: 122 mg/dL — ABNORMAL HIGH (ref 70–99)
Potassium: 3.8 mmol/L (ref 3.5–5.1)
Sodium: 141 mmol/L (ref 135–145)
Total Bilirubin: 0.5 mg/dL (ref 0.3–1.2)
Total Protein: 7.6 g/dL (ref 6.5–8.1)

## 2021-05-09 MED ORDER — PREDNISONE 20 MG PO TABS: 40.0000 mg | ORAL_TABLET | Freq: Every day | ORAL | 0 refills | Status: AC

## 2021-05-09 NOTE — ED Provider Notes (Signed)
History [facility]    CSN: [id number] Arrival date & time: 05/09/21  1432      History   Chief Complaint Chief Complaint  Patient presents with   Leg Swelling    HPI Nichole Barber is a 45 y.o. female presenting with complaint of pain and swelling of both feet for the last 2 days.  Chronic back pain, knee pain, hand pain.  This patient did have COVID-19 about 3 weeks ago, and these symptoms started 2 days ago.  She has completely recovered from the [place].  States that she finally went back to work 4 days ago, and her job does involve a lot of standing and walking.  Denies other symptoms including new cough, shortness of breath, chest pain, dizziness, shortness of breath at night, fever/chills.  HPI  Past Medical History:  Diagnosis Date   Anemia    Asthma    Bronchitis     Patient Active Problem List   Diagnosis Date Noted   Bilateral elbow joint pain 09/24/2020   Hypochromic microcytic anemia 10/08/2018   Pain in right knee 05/09/2018   Pain in both hands 07/05/2016   General medical exam 07/15/2015   Abnormal uterine bleeding 02/13/2013   Displacement of lumbar intervertebral disc 05/02/2011   Low back pain 05/02/2011    Past Surgical History:  Procedure Laterality Date   CARPAL TUNNEL RELEASE     CESAREAN SECTION     HERNIA REPAIR     KNEE SURGERY      OB History   No obstetric history on file.      Home Medications    Prior to Admission medications   Medication Sig Start Date End Date Taking? Authorizing Provider  predniSONE (DELTASONE) 20 MG tablet Take 2 tablets (40 mg total) by mouth daily for 5 days. Take with breakfast or lunch. Avoid NSAIDs (ibuprofen, etc) while taking this medication. 05/09/21 05/14/21 Yes Hazel Sams, PA-C  albuterol (VENTOLIN HFA) 108 (90 Base) MCG/ACT inhaler Frequency:PHARMDIR   Dosage:90   MCG  Instructions:  Note:used inhaler as needed daily for asthma attacks Dose: 90 MCG 08/08/12   [provider]  beclomethasone (QVAR) 40 MCG/ACT inhaler Frequency:PHARMDIR   Dosage:40   MCG  Instructions:  Note:use q var inhaler twice daily; if dose is lower than home dose please provide 80 mcg inhaler Dose: 40 MCG 08/08/12   [provider]  Cetirizine HCl 10 MG CAPS 10 mg. 11/22/11   [provider]  ferrous sulfate (EQL SLOW RELEASE IRON) 160 (50 Fe) MG TBCR SR tablet Take 1 tablet (160 mg total) by mouth daily. 02/22/17   Earleen Newport, MD  Fluocinolone Acetonide Body 0.01 % OIL 0.01 %. 09/29/13   [provider]  fluticasone (FLONASE) 50 MCG/ACT nasal spray FLUTICASONE PROPIONATE 50 MCG/ACT SUSP 08/04/13   [provider]  ibuprofen (ADVIL,MOTRIN) 800 MG tablet Take 1 tablet (800 mg total) by mouth every 8 (eight) hours as needed. 10/05/15   Beers, Pierce Crane, PA-C  lactulose (CHRONULAC) 10 GM/15ML solution Take 45 mLs (30 g total) by mouth daily. 03/09/21   White, Leitha Schuller, NP  montelukast (SINGULAIR) 10 MG tablet 10 mg. 11/10/14   [provider]  mupirocin cream (BACTROBAN) 2 % 2 %. 06/18/15   [provider]  naproxen (NAPROSYN) 375 MG tablet Take 1 tablet (375 mg total) by mouth 2 (two) times daily with a meal. Patient not taking: Reported on 05/09/2021 03/21/20   Jaynee Eagles, PA-C  nitroGLYCERIN (NITRODUR - DOSED IN MG/24 HR) 0.2 mg/hr patch Use 1/4 patch daily to the right elbow. 09/23/20   Dickie La, MD  omeprazole (PRILOSEC) 40 MG capsule 40 mg. 11/22/11   [provider]  ondansetron (ZOFRAN ODT) 8 MG disintegrating tablet Take 1 tablet (8 mg total) by mouth every 8 (eight) hours as needed for nausea or vomiting. Patient not taking: Reported on 05/09/2021 05/02/21   Hazel Sams, PA-C  Respiratory Therapy Supplies (PARI BABY CONVERSION KIT) [place] NEBULIZER 06/29/11   [provider]  SUMAtriptan (IMITREX) 25 MG tablet 25 mg. 09/17/13   [provider]    Family History History reviewed. No pertinent family  history.  Social History Social History   Tobacco Use   Smoking status: Every Day    Types: Cigarettes   Smokeless tobacco: Never  Vaping Use   Vaping Use: Never used  Substance Use Topics   Alcohol use: No   Drug use: No     Allergies   Amoxicillin-pot clavulanate, Feraheme [ferumoxytol], Metronidazole, and Pineapple   Review of Systems Review of Systems  Musculoskeletal:        Feet pain  All other systems reviewed and are negative.   Physical Exam Triage Vital Signs ED Triage Vitals  Enc Vitals Group     BP 05/09/21 1534 138/82     Pulse Rate 05/09/21 1534 96     Resp 05/09/21 1534 18     Temp 05/09/21 1534 98.9 F (37.2 C)     Temp Source 05/09/21 1534 Oral     SpO2 05/09/21 1534 97 %     Weight --      Height --      Head Circumference --      Peak Flow --      Pain Score 05/09/21 1528 8     Pain Loc --      Pain Edu? --      Excl. in [place]? --    No data found.  Updated Vital Signs BP 138/82 (BP Location: Left Arm)   Pulse 96   Temp 98.9 F (37.2 C) (Oral)   Resp 18   LMP 04/18/2021   SpO2 97%   Visual Acuity Right Eye Distance:   Left Eye Distance:   Bilateral Distance:    Right Eye Near:   Left Eye Near:    Bilateral Near:     Physical Exam Vitals reviewed.  Constitutional:      General: She is not in acute distress.    Appearance: Normal appearance. She is not ill-appearing or diaphoretic.  HENT:     Head: Normocephalic and atraumatic.  Cardiovascular:     Rate and Rhythm: Normal rate and regular rhythm.     Heart sounds: Normal heart sounds.  Pulmonary:     Effort: Pulmonary effort is normal.     Breath sounds: Normal breath sounds.  Musculoskeletal:     Comments: Bilateral feet- TTP plantar aspect, without obvious effusion or skin changes. No midfoot or bony tenderness. No malleolar tenderness. DP 2+, cap refill <2 seconds. Ambulating with pain.  Skin:    General: Skin is warm.  Neurological:     General: No focal deficit  present.     Mental Status: She is alert and oriented to person, place, and time.  Psychiatric:        Mood and Affect: Mood normal.        Behavior: Behavior normal.  Thought Content: Thought content normal.        Judgment: Judgment normal.     UC Treatments / Results  Labs (all labs ordered are listed, but only abnormal results are displayed) Labs Reviewed  COMPREHENSIVE METABOLIC PANEL    EKG   Radiology No results found.  Procedures Procedures (including critical care time)  Medications Ordered in UC Medications - No data to display  Initial Impression / Assessment and Plan / UC Course  I have reviewed the triage vital signs and the nursing notes.  Pertinent labs & imaging results that were available during my care of the patient were reviewed by me and considered in my medical decision making (see chart for details).     This patient is a very pleasant 45 y.o. year old female presenting with suspected plantar fasciitis related to prolonged standing/overuse.  This patient did have COVID-19 about 3 weeks ago, but had completely recovered from this before symptoms started.  Suspect that this is related to her resuming work 4 days ago (prolonged standing).  We will check a CMP.  Recommended RICE, supportive footwear.  Follow-up with PCP/podiatry if symptoms persist ED return precautions discussed. Patient verbalizes understanding and agreement.    Final Clinical Impressions(s) / UC Diagnoses   Final diagnoses:  Plantar fasciitis     Discharge Instructions      -Prednisone, 2 pills taken at the same time for 5 days in a row.  Try taking this earlier in the day as it can give you energy. Avoid NSAIDs like ibuprofen and alleve while taking this medication as they can increase your risk of stomach upset and even GI bleeding when in combination with a steroid. You can continue tylenol (acetaminophen) up to 1018m 3x daily. -Wear good shoes, try to limit prolonged  standing and walking, elevate feet at the end of the day, try compression stockings. -We will call you if your lab work is abnormal.   ED Prescriptions     Medication Sig Dispense Auth. Provider   predniSONE (DELTASONE) 20 MG tablet Take 2 tablets (40 mg total) by mouth daily for 5 days. Take with breakfast or lunch. Avoid NSAIDs (ibuprofen, etc) while taking this medication. 10 tablet GHazel Sams PA-C      PDMP not reviewed this encounter.   GHazel Sams PA-C 05/09/21 1559

## 2021-05-09 NOTE — ED Triage Notes (Signed)
Patient has bilateral pedal swelling that started yesterday.  No history of this.  No changes in medicine.  Left foot is slightly puffier than right foot.

## 2021-05-09 NOTE — Discharge Instructions (Addendum)
-  Prednisone, 2 pills taken at the same time for 5 days in a row.  Try taking this earlier in the day as it can give you energy. Avoid NSAIDs like ibuprofen and alleve while taking this medication as they can increase your risk of stomach upset and even GI bleeding when in combination with a steroid. You can continue tylenol (acetaminophen) up to 1000mg  3x daily. -Wear good shoes, try to limit prolonged standing and walking, elevate feet at the end of the day, try compression stockings. -We will call you if your lab work is abnormal.

## 2021-05-13 ENCOUNTER — Other Ambulatory Visit: Payer: Self-pay

## 2021-05-13 ENCOUNTER — Ambulatory Visit (HOSPITAL_COMMUNITY)
Admission: EM | Admit: 2021-05-13 | Discharge: 2021-05-13 | Disposition: A | Discharge status: Home / Self Care | Payer: Medicaid Other | Attending: Emergency Medicine | Admitting: Emergency Medicine

## 2021-05-13 ENCOUNTER — Encounter (HOSPITAL_COMMUNITY): Payer: Self-pay | Admitting: Emergency Medicine

## 2021-05-13 DIAGNOSIS — M79671 Pain in right foot: Secondary | ICD-10-CM | POA: Diagnosis not present

## 2021-05-13 DIAGNOSIS — M79672 Pain in left foot: Secondary | ICD-10-CM

## 2021-05-13 MED ORDER — KETOROLAC TROMETHAMINE 30 MG/ML IJ SOLN
30.0000 mg | Freq: Once | INTRAMUSCULAR | Status: AC
  Administered 2021-05-13: 30 mg via INTRAMUSCULAR

## 2021-05-13 MED ORDER — KETOROLAC TROMETHAMINE 30 MG/ML IJ SOLN
INTRAMUSCULAR | Status: AC
  Filled 2021-05-13: qty 1

## 2021-05-13 MED ORDER — MELOXICAM 7.5 MG PO TABS: 7.5000 mg | ORAL_TABLET | Freq: Every day | ORAL | 0 refills | Status: DC

## 2021-05-13 NOTE — Discharge Instructions (Signed)
Take the meloxicam daily to help with your foot pain.  You may also take Tylenol as needed.  You can wear a sleeping stretch boot while sleeping at night. You can freeze a water bottle and roll it underneath your foot as needed for pain. You can use heat, ice, or alternate between heat and ice as needed for comfort.  You may use IcyHot, lidocaine patches, Voltaren gel, Biofreeze, or Aspercreme as needed for pain relief. You can do the exercises included to help with pain.  Follow-up with podiatry for reevaluation of soon as possible.

## 2021-05-13 NOTE — ED Triage Notes (Signed)
Pt c/o bilat feet swelling that has been ongoing for a couple days. Reports feet feel on fire  tylenol nor prednisone prescribed on Tuesday are not helping. Job requires being on feet for long period of time.

## 2021-05-13 NOTE — ED Provider Notes (Signed)
[facility]    CSN: [id number] Arrival date & time: 05/13/21  1621      History   Chief Complaint Chief Complaint  Patient presents with   Foot Swelling    HPI Nichole Barber is a 45 y.o. female.   Patient here for bilateral feet swelling and pain that has been ongoing for the past several days.  Patient was seen earlier this week and was prescribed prednisone.  Reports taking prednisone with minimal symptom relief.  Also taking Tylenol.  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  Reports pain is worse when standing or walking.  Pain is in heel and feels like a burning or stabbing.  Denies any trauma, injury, or other precipitating event.  Denies any fevers, chest pain, shortness of breath, N/V/D, numbness, tingling, weakness, abdominal pain, or headaches.    The history is provided by the patient.   Past Medical History:  Diagnosis Date   Anemia    Asthma    Bronchitis     Patient Active Problem List   Diagnosis Date Noted   Bilateral elbow joint pain 09/24/2020   Hypochromic microcytic anemia 10/08/2018   Pain in right knee 05/09/2018   Pain in both hands 07/05/2016   General medical exam 07/15/2015   Abnormal uterine bleeding 02/13/2013   Displacement of lumbar intervertebral disc 05/02/2011   Low back pain 05/02/2011    Past Surgical History:  Procedure Laterality Date   CARPAL TUNNEL RELEASE     CESAREAN SECTION     HERNIA REPAIR     KNEE SURGERY      OB History   No obstetric history on file.      Home Medications    Prior to Admission medications   Medication Sig Start Date End Date Taking? Authorizing Provider  meloxicam (MOBIC) 7.5 MG tablet Take 1 tablet (7.5 mg total) by mouth daily. 05/13/21  Yes Pearson Forster, NP  albuterol (VENTOLIN HFA) 108 (90 Base) MCG/ACT inhaler Frequency:PHARMDIR   Dosage:90   MCG  Instructions:  Note:used inhaler as needed daily for asthma attacks Dose: 90 MCG 08/08/12    [provider]  beclomethasone (QVAR) 40 MCG/ACT inhaler Frequency:PHARMDIR   Dosage:40   MCG  Instructions:  Note:use q var inhaler twice daily; if dose is lower than home dose please provide 80 mcg inhaler Dose: 40 MCG 08/08/12   [provider]  Cetirizine HCl 10 MG CAPS 10 mg. 11/22/11   [provider]  ferrous sulfate (EQL SLOW RELEASE IRON) 160 (50 Fe) MG TBCR SR tablet Take 1 tablet (160 mg total) by mouth daily. 02/22/17   Earleen Newport, MD  Fluocinolone Acetonide Body 0.01 % OIL 0.01 %. 09/29/13   [provider]  fluticasone (FLONASE) 50 MCG/ACT nasal spray FLUTICASONE PROPIONATE 50 MCG/ACT SUSP 08/04/13   [provider]  ibuprofen (ADVIL,MOTRIN) 800 MG tablet Take 1 tablet (800 mg total) by mouth every 8 (eight) hours as needed. 10/05/15   Beers, Pierce Crane, PA-C  lactulose (CHRONULAC) 10 GM/15ML solution Take 45 mLs (30 g total) by mouth daily. 03/09/21   White, Leitha Schuller, NP  montelukast (SINGULAIR) 10 MG tablet 10 mg. 11/10/14   [provider]  mupirocin cream (BACTROBAN) 2 % 2 %. 06/18/15   [provider]  nitroGLYCERIN (NITRODUR - DOSED IN MG/24 HR) 0.2 mg/hr patch Use 1/4 patch daily to the right elbow. 09/23/20   Dickie La, MD  omeprazole (PRILOSEC) 40 MG capsule 40 mg. 11/22/11   [provider]  ondansetron (ZOFRAN ODT) 8 MG disintegrating tablet Take 1 tablet (8 mg total) by mouth every 8 (eight) hours as needed for nausea or vomiting. Patient not taking: Reported on 05/09/2021 05/02/21   Hazel Sams, PA-C  predniSONE (DELTASONE) 20 MG tablet Take 2 tablets (40 mg total) by mouth daily for 5 days. Take with breakfast or lunch. Avoid NSAIDs (ibuprofen, etc) while taking this medication. 05/09/21 05/14/21  Hazel Sams, PA-C  Respiratory Therapy Supplies (PARI BABY CONVERSION KIT) [place] NEBULIZER 06/29/11   [provider]  SUMAtriptan (IMITREX) 25 MG tablet 25 mg. 09/17/13   [provider]    Family History No family history on file.  Social History Social History   Tobacco Use   Smoking status: Every Day    Types: Cigarettes   Smokeless tobacco: Never  Vaping Use   Vaping Use: Never used  Substance Use Topics   Alcohol use: No   Drug use: No     Allergies   Amoxicillin-pot clavulanate, Feraheme [ferumoxytol], Metronidazole, and Pineapple   Review of Systems Review of Systems  Musculoskeletal:  Positive for arthralgias and joint swelling.  All other systems reviewed and are negative.   Physical Exam Triage Vital Signs ED Triage Vitals  Enc Vitals Group     BP 05/13/21 1718 (!) 163/89     Pulse Rate 05/13/21 1718 81     Resp 05/13/21 1718 17     Temp 05/13/21 1718 98.4 F (36.9 C)     Temp Source 05/13/21 1718 Oral     SpO2 05/13/21 1718 97 %     Weight --      Height --      Head Circumference --      Peak Flow --      Pain Score 05/13/21 1716 10     Pain Loc --      Pain Edu? --      Excl. in [place]? --    No data found.  Updated Vital Signs BP (!) 163/89 (BP Location: Right Arm)   Pulse 81   Temp 98.4 F (36.9 C) (Oral)   Resp 17   LMP 04/18/2021   SpO2 97%   Visual Acuity Right Eye Distance:   Left Eye Distance:   Bilateral Distance:    Right Eye Near:   Left Eye Near:    Bilateral Near:     Physical Exam Vitals and nursing note reviewed.  Constitutional:      General: She is not in acute distress.    Appearance: Normal appearance. She is not ill-appearing, toxic-appearing or diaphoretic.  HENT:     Head: Normocephalic and atraumatic.  Eyes:     Conjunctiva/sclera: Conjunctivae normal.  Cardiovascular:     Rate and Rhythm: Normal rate.     Pulses: Normal pulses.     Heart sounds: Normal heart sounds.  Pulmonary:     Effort: Pulmonary effort is normal.     Breath sounds: Normal breath sounds.  Abdominal:     General: Abdomen is flat.  Musculoskeletal:        General: Normal range of motion.      Cervical back: Normal range of motion.     Right foot: Normal range of motion and normal capillary refill. Tenderness present. No deformity. Normal pulse.     Left foot: Normal range of motion and normal capillary refill. Tenderness present. No deformity.  Normal pulse.  Feet:     Right foot:     Skin integrity: Skin integrity normal. No erythema or warmth.     Left foot:     Skin integrity: Skin integrity normal. No erythema or warmth.  Skin:    General: Skin is warm and dry.  Neurological:     General: No focal deficit present.     Mental Status: She is alert and oriented to person, place, and time.  Psychiatric:        Mood and Affect: Mood normal.     UC Treatments / Results  Labs (all labs ordered are listed, but only abnormal results are displayed) Labs Reviewed - No data to display  EKG   Radiology No results found.  Procedures Procedures (including critical care time)  Medications Ordered in UC Medications  ketorolac (TORADOL) 30 MG/ML injection 30 mg (has no administration in time range)    Initial Impression / Assessment and Plan / UC Course  I have reviewed the triage vital signs and the nursing notes.  Pertinent labs & imaging results that were available during my care of the patient were reviewed by me and considered in my medical decision making (see chart for details).    Assessment negative for red flags or concerns.  Bilateral foot pain with concern for possible plantar fasciitis.  Patient has completed steroids.  Ketorolac IM administered in office.  Will prescribe meloxicam daily to help with pain.  May also take Tylenol.  Recommend sleeping stretch boots at night.  May freeze a water bottle and roll it underneath the foot.  Discussed conservative symptom management including heat, ice, and OTC pain relievers.  Patient given stretches to try and help with pain.  Referral placed for podiatry.  Follow-up with podiatry as soon as possible for  reevaluation. Final Clinical Impressions(s) / UC Diagnoses   Final diagnoses:  Bilateral foot pain     Discharge Instructions      Take the meloxicam daily to help with your foot pain.  You may also take Tylenol as needed.  You can wear a sleeping stretch boot while sleeping at night. You can freeze a water bottle and roll it underneath your foot as needed for pain. You can use heat, ice, or alternate between heat and ice as needed for comfort.  You may use IcyHot, lidocaine patches, Voltaren gel, Biofreeze, or Aspercreme as needed for pain relief. You can do the exercises included to help with pain.  Follow-up with podiatry for reevaluation of soon as possible.     ED Prescriptions     Medication Sig Dispense Auth. Provider   meloxicam (MOBIC) 7.5 MG tablet Take 1 tablet (7.5 mg total) by mouth daily. 30 tablet Pearson Forster, NP      PDMP not reviewed this encounter.   Pearson Forster, NP 05/13/21 1759

## 2021-05-15 ENCOUNTER — Encounter: Payer: Self-pay | Admitting: Hematology and Oncology

## 2021-05-22 ENCOUNTER — Encounter: Payer: Self-pay | Admitting: Podiatry

## 2021-05-22 ENCOUNTER — Ambulatory Visit: Payer: Medicaid Other | Admitting: Podiatry

## 2021-05-22 ENCOUNTER — Ambulatory Visit (INDEPENDENT_AMBULATORY_CARE_PROVIDER_SITE_OTHER): Payer: Medicaid Other

## 2021-05-22 ENCOUNTER — Other Ambulatory Visit: Payer: Self-pay

## 2021-05-22 DIAGNOSIS — G6289 Other specified polyneuropathies: Secondary | ICD-10-CM

## 2021-05-22 DIAGNOSIS — R2243 Localized swelling, mass and lump, lower limb, bilateral: Secondary | ICD-10-CM

## 2021-05-22 DIAGNOSIS — G5753 Tarsal tunnel syndrome, bilateral lower limbs: Secondary | ICD-10-CM

## 2021-05-22 NOTE — Progress Notes (Signed)
  Subjective:  Patient ID: Nichole Barber, female    DOB: 04-May-1976,  MRN: [id number]  Chief Complaint  Patient presents with   Numbness    NP bilat swelling and pain, pins and needles    45 y.o. female presents with the above complaint. History confirmed with patient.  She has been treated multiple times with emergency room and urgent care.  They thought this was plantar fasciitis and put her on prednisone as well as muscle CAM boot and gabapentin also gave her a steroid injection.  None of this is helped.  It began last year and it continues to get worse.  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  Objective:  Physical Exam: warm, good capillary refill, no trophic changes or ulcerative lesions, normal DP and PT pulses, and subjective paresthesias and numbness, positive Tinel's sign in the tarsal tunnel radiates into both feet the right is worse than the left.  Positive straight leg raise test.   Radiographs: Multiple views x-ray of both feet: no fracture, dislocation, swelling or degenerative changes noted, mild pes planus forming Assessment:   1. Other polyneuropathy   2. Tarsal tunnel syndrome, bilateral      Plan:  Patient was evaluated and treated and all questions answered.  Reviewed the radiographs and my clinical exam findings as well as review of the emergency room and urgent care notes with her.  I discussed with her that I do not think this is [place] fasciitis and her symptoms were consistent with a tarsal tunnel syndrome polyneuropathy or lumbosacral radiculopathy causing foot pain and numbness.  I would like to evaluate with MRIs of both ankles to evaluate for tarsal tunnel syndrome and I have ordered these.  Also recommend evaluation with nerve conduction studies and EMG with neurology consult.  The straight leg raise test makes me suspect lumbosacral radiculopathy but she does not report any shooting pain down either hip sciatica or low back pain.  Pending results of the MRI and  nerve conduction studies we will see her back and discuss further options including referral and/or surgical consideration or injection if necessary.  No follow-ups on file.

## 2021-05-23 ENCOUNTER — Encounter: Payer: Self-pay | Admitting: Neurology

## 2021-05-29 ENCOUNTER — Ambulatory Visit (HOSPITAL_COMMUNITY)
Admission: EM | Admit: 2021-05-29 | Discharge: 2021-05-29 | Disposition: A | Discharge status: Home / Self Care | Payer: Medicaid Other | Attending: Emergency Medicine | Admitting: Emergency Medicine

## 2021-05-29 ENCOUNTER — Other Ambulatory Visit: Payer: Self-pay

## 2021-05-29 ENCOUNTER — Encounter (HOSPITAL_COMMUNITY): Payer: Self-pay

## 2021-05-29 DIAGNOSIS — M25512 Pain in left shoulder: Secondary | ICD-10-CM | POA: Diagnosis not present

## 2021-05-29 DIAGNOSIS — M25511 Pain in right shoulder: Secondary | ICD-10-CM | POA: Diagnosis not present

## 2021-05-29 DIAGNOSIS — M545 Low back pain, unspecified: Secondary | ICD-10-CM | POA: Diagnosis not present

## 2021-05-29 MED ORDER — KETOROLAC TROMETHAMINE 30 MG/ML IJ SOLN
INTRAMUSCULAR | Status: AC
  Filled 2021-05-29: qty 1

## 2021-05-29 MED ORDER — MELOXICAM 15 MG PO TABS: 15.0000 mg | ORAL_TABLET | Freq: Every day | ORAL | 0 refills | Status: DC

## 2021-05-29 MED ORDER — CYCLOBENZAPRINE HCL 10 MG PO TABS: 10.0000 mg | ORAL_TABLET | Freq: Two times a day (BID) | ORAL | 0 refills | Status: DC | PRN

## 2021-05-29 MED ORDER — KETOROLAC TROMETHAMINE 30 MG/ML IJ SOLN
30.0000 mg | Freq: Once | INTRAMUSCULAR | Status: AC
Start: 2021-05-29 — End: 2021-05-29
  Administered 2021-05-29: 30 mg via INTRAMUSCULAR

## 2021-05-29 NOTE — Discharge Instructions (Addendum)
Your pain is most likely caused by irritation to the muscles or ligaments.   May use flexeril as needed twice a day  May use meloxicam once every morning  as needed   You may use heating pad in 15 minute intervals as needed for additional comfort, within the first 2-3 days you may find comfort in using ice in 10-15 minutes over affected area  Begin stretching affected area daily for 10 minutes as tolerated to further loosen muscles   When lying down place pillow underneath and between knees for support  Can try sleeping without pillow on firm mattress   Practice good posture: head back, shoulders back, chest forward, pelvis back and weight distributed evenly on both legs  If pain persist after recommended treatment or reoccurs if may be beneficial to follow up with orthopedic specialist for evaluation, this doctor specializes in the bones and can manage your symptoms long-term with options such as but not limited to imaging, medications or physical therapy
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
## 2021-07-12 ENCOUNTER — Ambulatory Visit
Admission: RE | Admit: 2021-07-12 | Discharge: 2021-07-12 | Disposition: A | Discharge status: Home / Self Care | Payer: Medicaid Other | Source: Ambulatory Visit | Attending: Podiatry | Admitting: Podiatry

## 2021-07-12 ENCOUNTER — Other Ambulatory Visit: Payer: Self-pay

## 2021-07-12 DIAGNOSIS — G5753 Tarsal tunnel syndrome, bilateral lower limbs: Secondary | ICD-10-CM

## 2021-07-12 IMAGING — MR MR [PERSON_NAME] LOW WO/W CM*L*
9 series · 39 of 40 positions shown · IV contrast (multihance)
Comparison: Left foot radiograph [DATE].

CLINICAL DATA: Foot pain, chronic, tarsal tunnel syndrome suspected

EXAM:
MRI OF LOWER LEFT EXTREMITY WITHOUT AND WITH CONTRAST
TECHNIQUE: Multiplanar, multisequence MR imaging of the left was performed both
before and after administration of intravenous contrast.
CONTRAST:  16mL MULTIHANCE GADOBENATE DIMEGLUMINE 529 MG/ML IV SOLN

[Series 4: T2 fat-sat · axial · 3.0mm · 0.50mm/px · z∈[-99,+21]mm · 4 of 32 slices shown (1 of 2)]
[im 1/32]
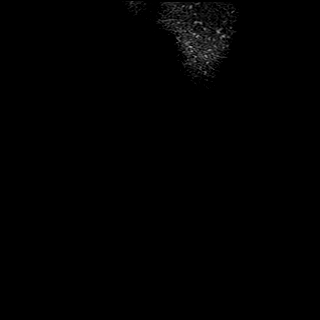
[im 11/32]
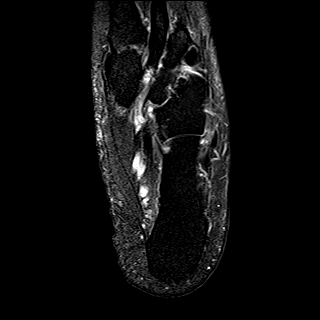
[im 21/32]
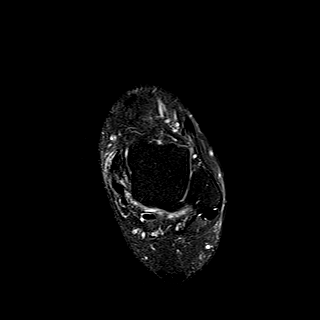
[im 32/32]
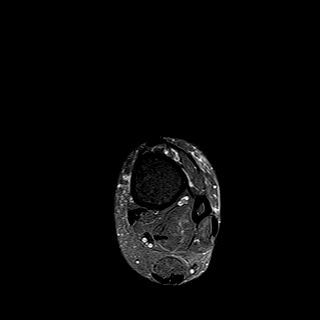

[Series 5: PD fat-sat · axial · 3.0mm · 0.42mm/px · z∈[-99,+21]mm · 5 of 32 slices shown]
[im 1/32]
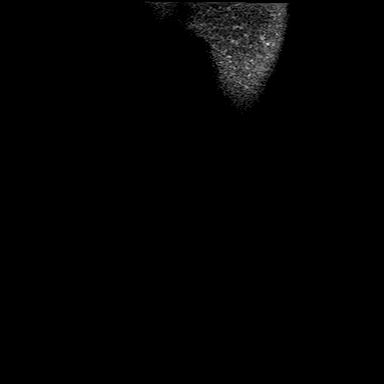
[im 8/32]
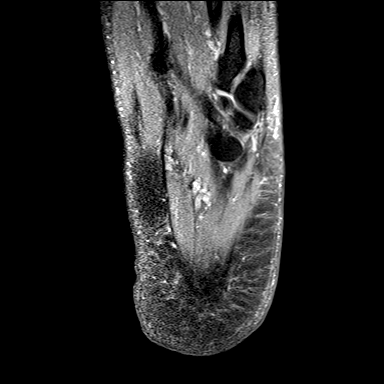
[im 16/32]
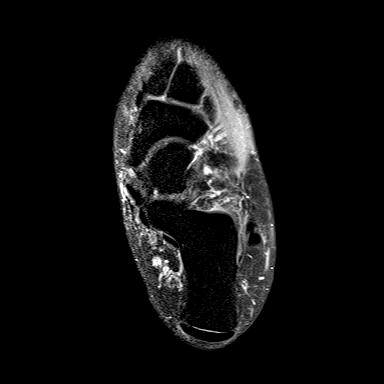
[im 24/32]
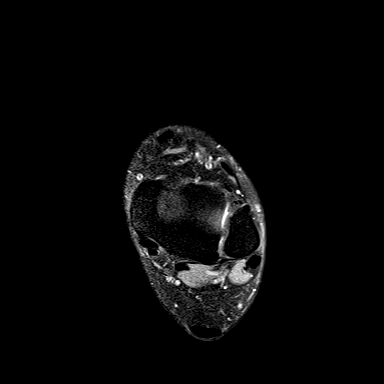
[im 32/32]
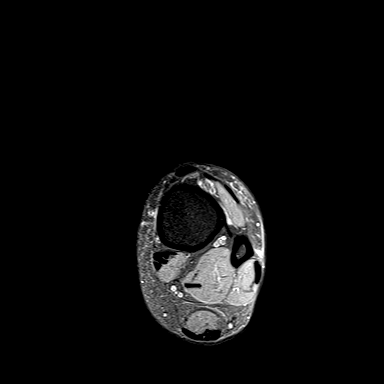

[Series 6: T1 · sagittal · 4.0mm · 0.56mm/px · 3 of 18 slices shown]
[im 1/18]
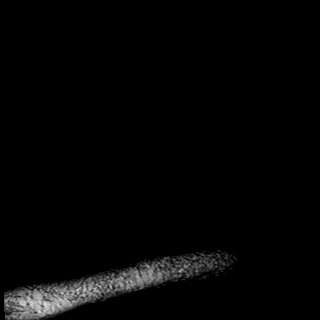
[im 9/18]
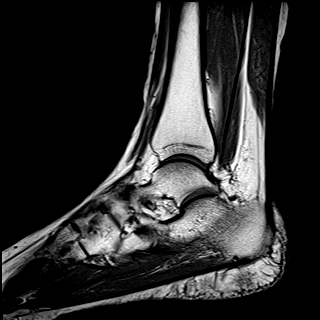
[im 18/18]
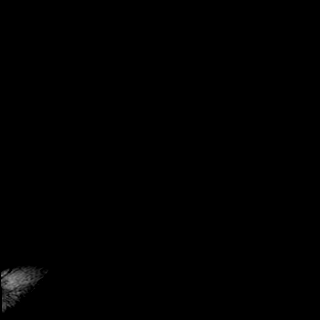

[Series 7: STIR · sagittal · 4.0mm · 0.35mm/px · 3 of 18 slices shown]
[im 1/18]
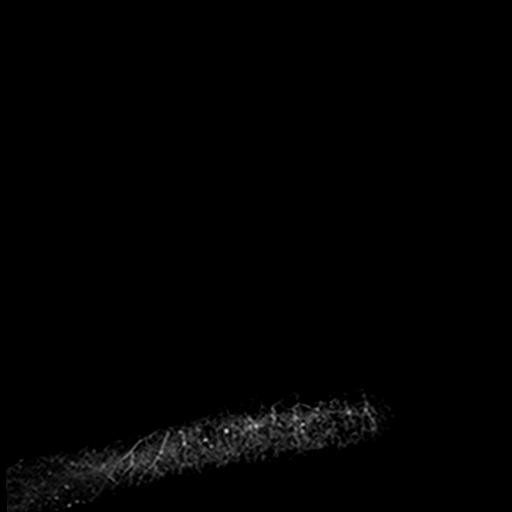
[im 9/18]
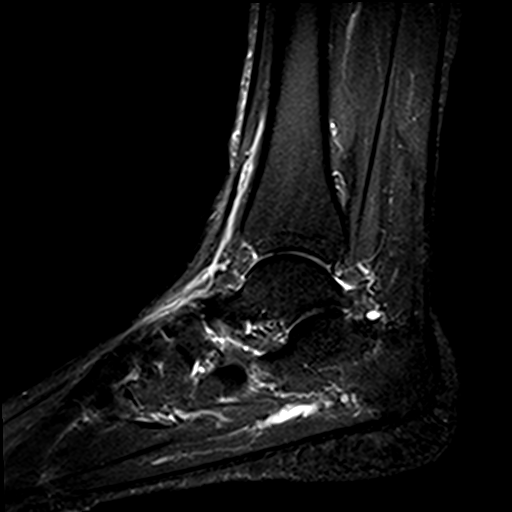
[im 18/18]
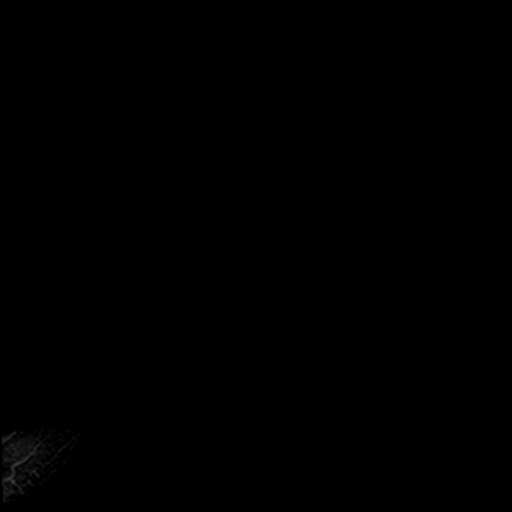

[Series 8: T2 fat-sat · coronal · 3.0mm · 0.50mm/px · 6 of 36 slices shown (2 of 2)]
[im 1/36]
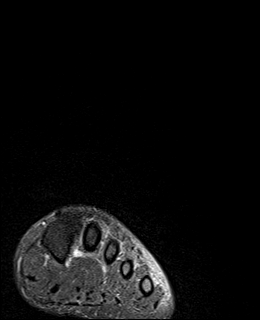
[im 8/36]
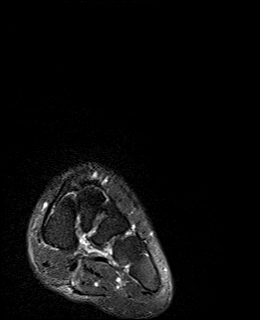
[im 15/36]
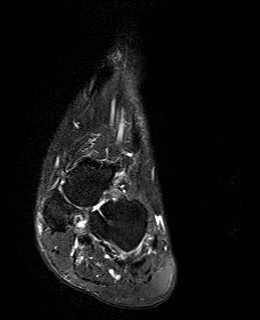
[im 22/36]
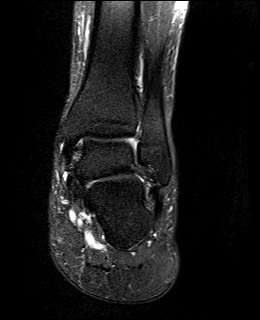
[im 29/36]
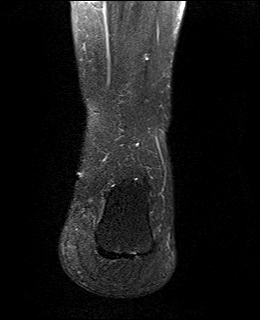
[im 36/36]
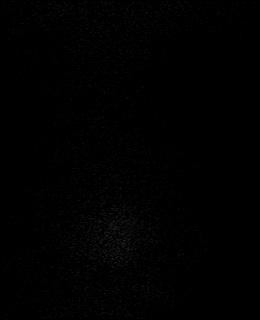

[Series 9: T1 fat-sat · axial · 3.0mm · 0.62mm/px · z∈[-100,+21]mm · 5 of 32 slices shown]
[im 1/32]
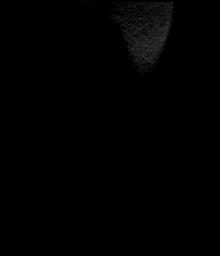
[im 8/32]
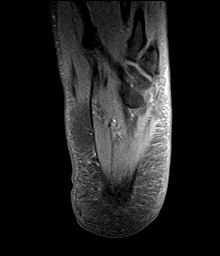
[im 16/32]
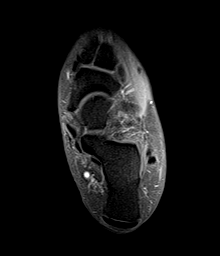
[im 24/32]
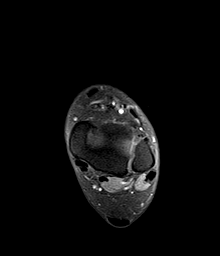
[im 32/32]
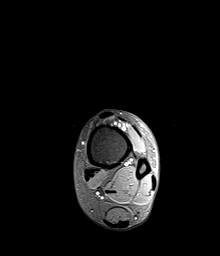

[Series 12: T1 post-contrast · axial · 3.0mm · 0.62mm/px · z∈[-96,+25]mm · 5 of 32 slices shown (1 of 2)]
[im 1/32]
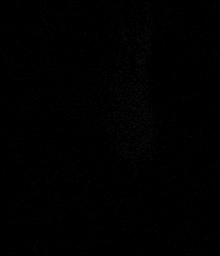
[im 8/32]
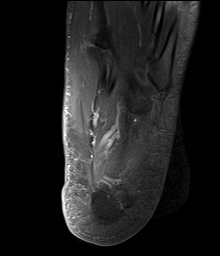
[im 16/32]
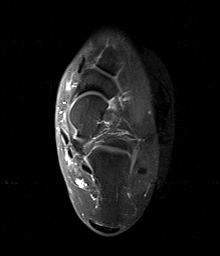
[im 24/32]
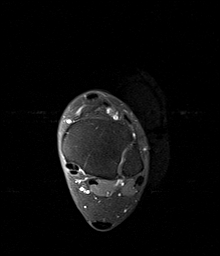
[im 32/32]
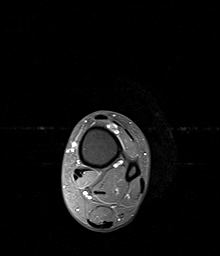

[Series 13: T1 post-contrast · sagittal · 4.0mm · 0.56mm/px · 3 of 20 slices shown (2 of 2)]
[im 1/20]
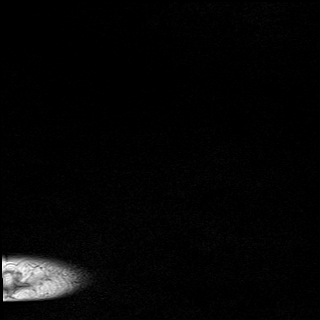
[im 10/20]
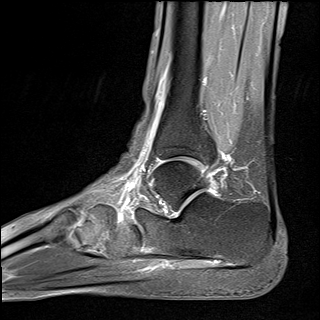
[im 20/20]
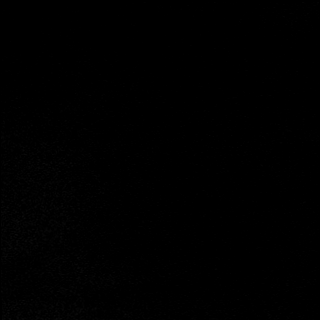

[Series 14: cor post · coronal · 3.0mm · 0.31mm/px · 5 of 37 slices shown]
[im 1/37]
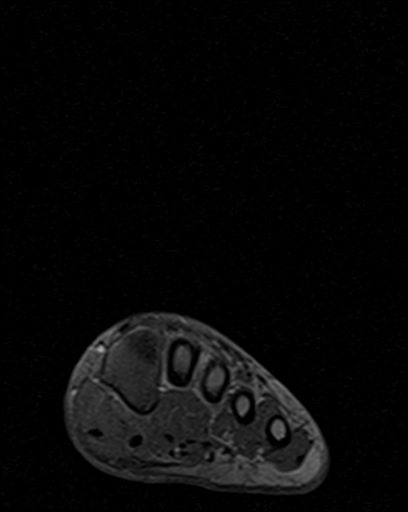
[im 8/37]
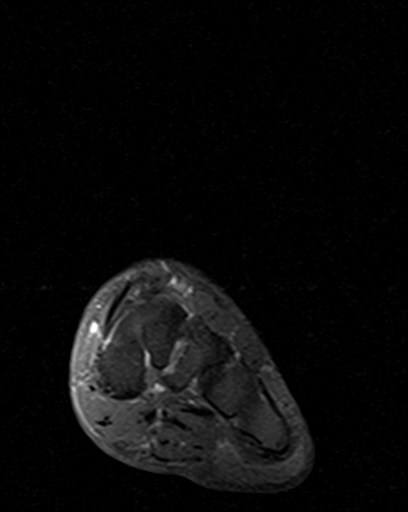
[im 15/37]
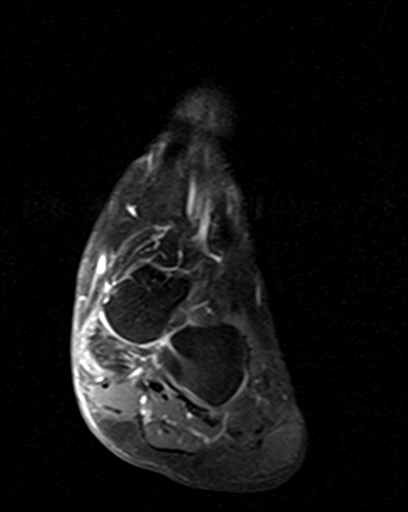
[im 22/37]
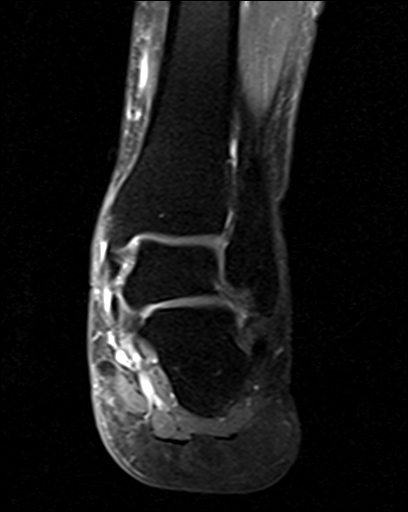
[im 29/37]
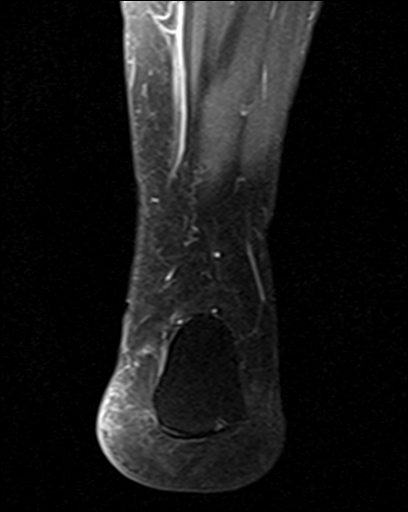

[39 of 40 positions shown; findings below may reference images not displayed]

FINDINGS: TENDONS

Peroneal: Intact peroneus longus and peroneus brevis tendons.

Posteromedial: There is minimal tenosynovitis of the posterior
tibial tendon above and below the medial malleolus. There is a small
short segment cleft within the tendon just distal to the
myotendinous junction above the malleolus (axial PD and T2 image 5).
The other flexor tendons are unremarkable.

Anterior: Intact tibialis anterior, extensor hallucis longus and
extensor digitorum longus tendons.

Achilles: Intact.

Plantar Fascia: Intact.

LIGAMENTS

Lateral: Anterior talofibular ligament intact. Calcaneofibular
ligament intact. Posterior talofibular ligament intact. Anterior and
posterior tibiofibular ligaments intact.

Medial: Deltoid ligament intact. Spring ligament intact.

CARTILAGE

Ankle Joint: No joint effusion. Normal ankle mortise. No chondral
defect.

Subtalar Joints/Sinus Tarsi: Normal subtalar joints. No subtalar
joint effusion. Normal sinus tarsi.

Bones: No marrow signal abnormality.  No fracture or dislocation.

Soft Tissue: No fluid collection or hematoma. Muscles are normal
without edema or atrophy. Tarsal tunnel is normal.
IMPRESSION: Minimal tenosynovitis of the posterior tibial tendon above and below
the medial malleolus.

Short segment cleft within the posterior tibial tendon just distal
to the myotendinous junction above the malleolus, which could
represent a small focal split tear versus anatomic variation. The
tendon is intact distally.

Intact ankle ligaments.  No acute osseous abnormality.

## 2021-07-12 IMAGING — MR MR HEEL *R* WO/W CM
10 series · 40 of 40 positions shown · IV contrast (multihance)
Comparison: Right foot radiograph [DATE].

CLINICAL DATA: Chronic foot and bilateral heel pain for 3 months.
Clinical concern for tarsal tunnel syndrome.

EXAM:
MR OF THE RIGHT HEEL WITHOUT AND WITH CONTRAST
TECHNIQUE: Multiplanar, multisequence MR imaging of the right heel was
performed both before and after administration of intravenous
contrast.
CONTRAST:  16mL MULTIHANCE GADOBENATE DIMEGLUMINE 529 MG/ML IV SOLN

[Series 4: T2 fat-sat · axial · 3.0mm · 0.50mm/px · z∈[-87,+34]mm · 4 of 32 slices shown (1 of 3)]
[im 1/32]
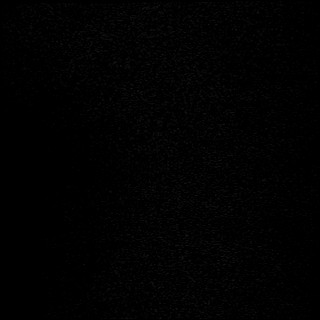
[im 11/32]
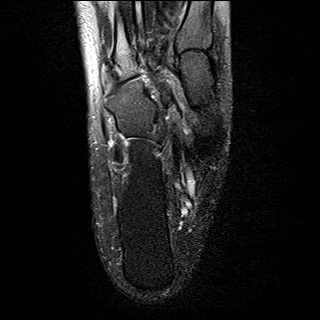
[im 21/32]
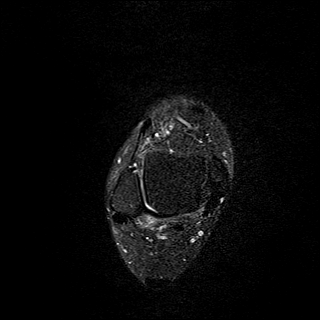
[im 32/32]
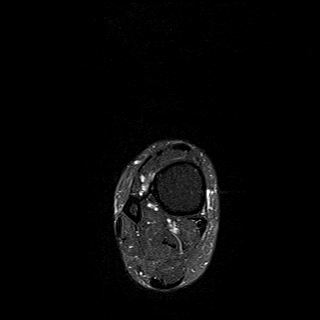

[Series 5: PD fat-sat · axial · 3.0mm · 0.50mm/px · z∈[-87,+34]mm · 4 of 32 slices shown]
[im 1/32]
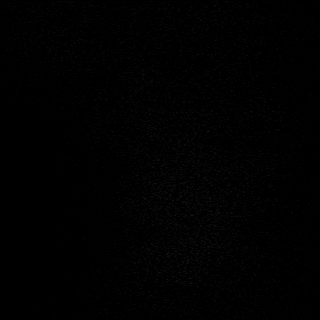
[im 11/32]
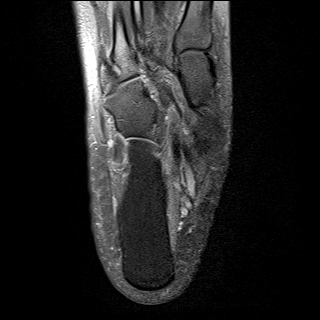
[im 21/32]
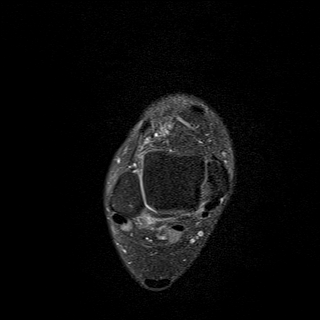
[im 32/32]
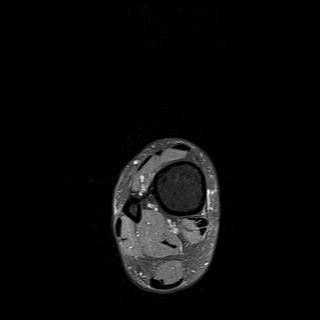

[Series 6: T1 · sagittal · 4.0mm · 0.56mm/px · 3 of 20 slices shown]
[im 1/20]
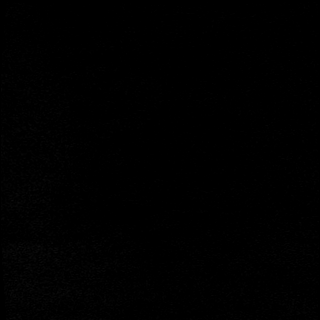
[im 10/20]
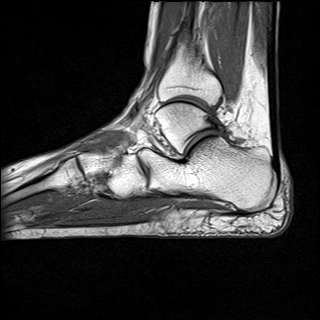
[im 20/20]
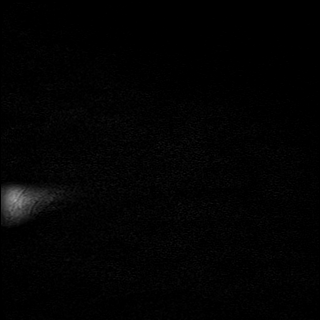

[Series 7: STIR · sagittal · 4.0mm · 0.35mm/px · 3 of 20 slices shown]
[im 1/20]
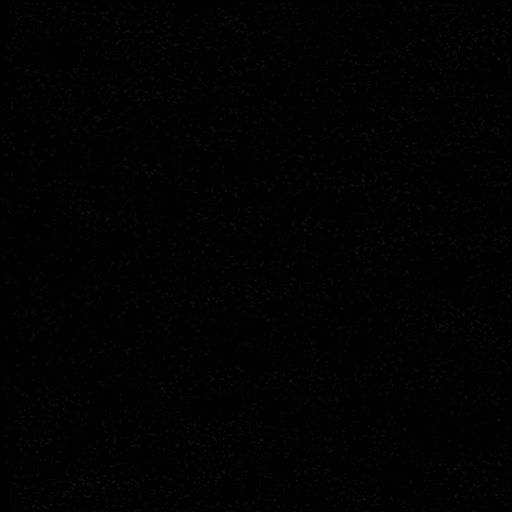
[im 10/20]
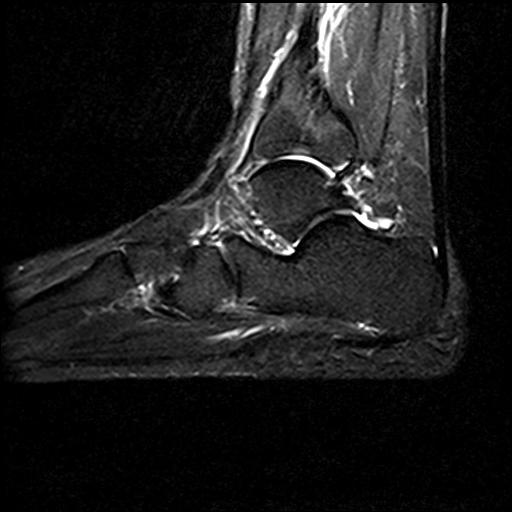
[im 20/20]
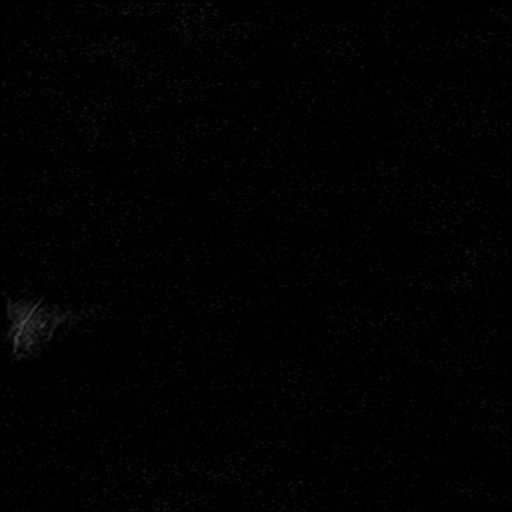

[Series 8: T2 fat-sat · coronal · 3.0mm · 0.50mm/px · 5 of 37 slices shown (2 of 3)]
[im 1/37]
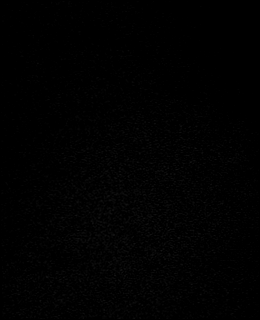
[im 10/37]
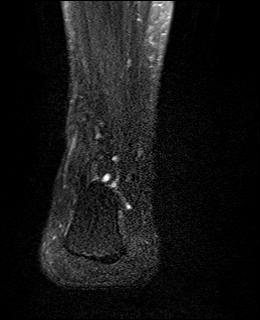
[im 19/37]
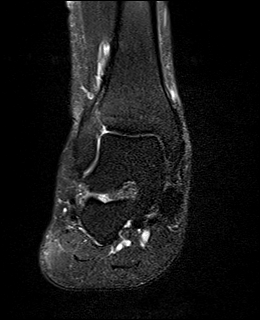
[im 28/37]
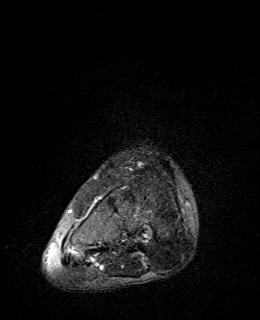
[im 37/37]
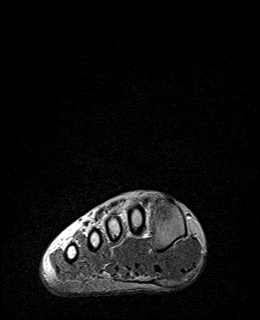

[Series 9: T1 fat-sat · axial · 3.0mm · 0.62mm/px · z∈[-87,+34]mm · 4 of 32 slices shown]
[im 1/32]
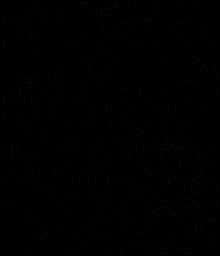
[im 11/32]
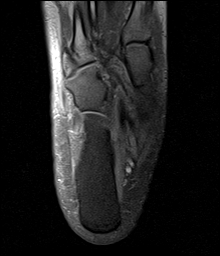
[im 21/32]
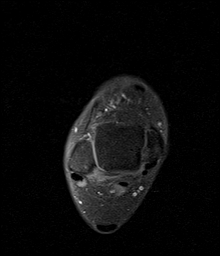
[im 32/32]
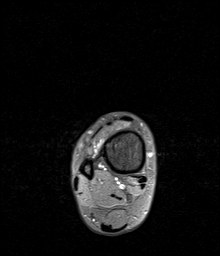

[Series 10: T2 fat-sat · coronal · 3.0mm · 0.50mm/px · 5 of 37 slices shown (3 of 3)]
[im 1/37]
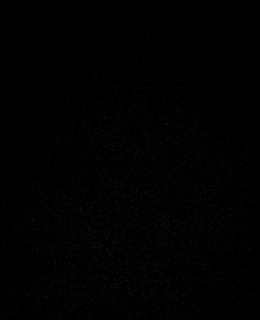
[im 10/37]
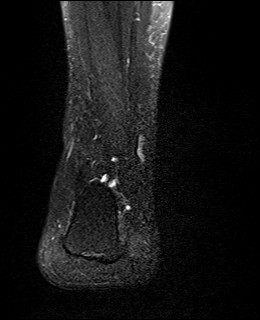
[im 19/37]
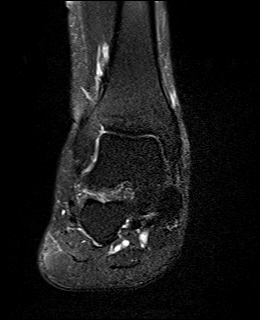
[im 28/37]
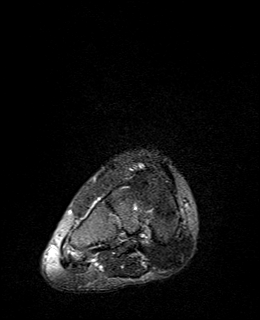
[im 37/37]
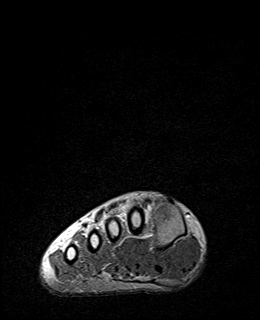

[Series 11: T1 fat-sat post-contrast · axial · 3.0mm · 0.62mm/px · z∈[-83,+34]mm · 4 of 31 slices shown (1 of 3)]
[im 1/31]
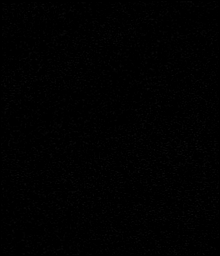
[im 11/31]
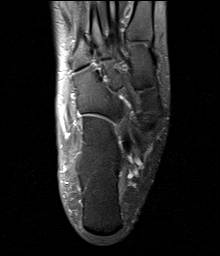
[im 21/31]
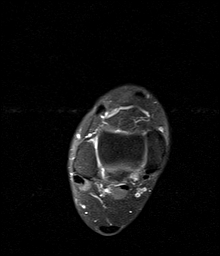
[im 31/31]
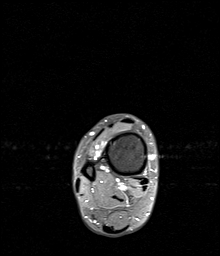

[Series 12: T1 fat-sat post-contrast · sagittal · 4.0mm · 0.56mm/px · 3 of 20 slices shown (2 of 3)]
[im 1/20]
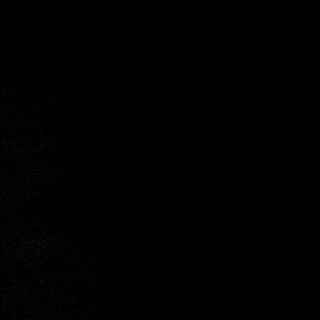
[im 10/20]
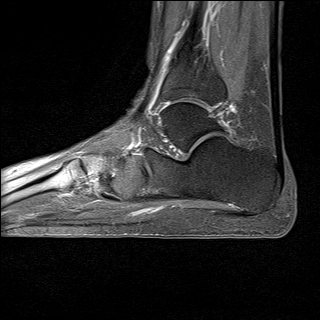
[im 20/20]
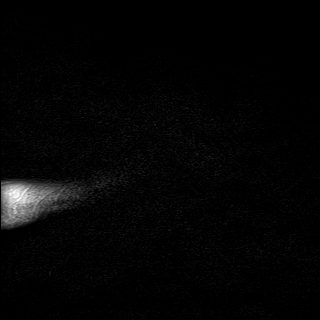

[Series 13: T1 fat-sat post-contrast · coronal · 3.0mm · 0.31mm/px · 5 of 37 slices shown (3 of 3)]
[im 1/37]
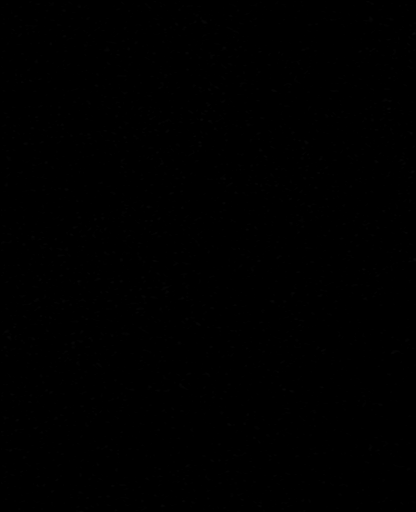
[im 10/37]
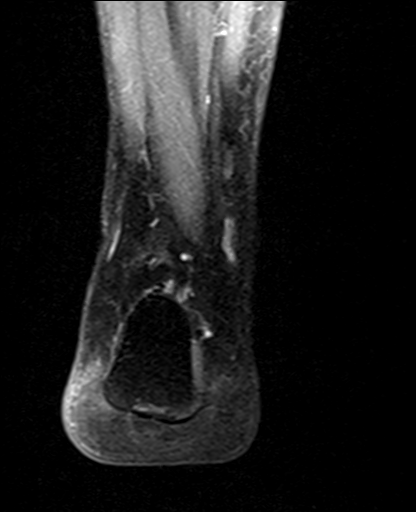
[im 19/37]
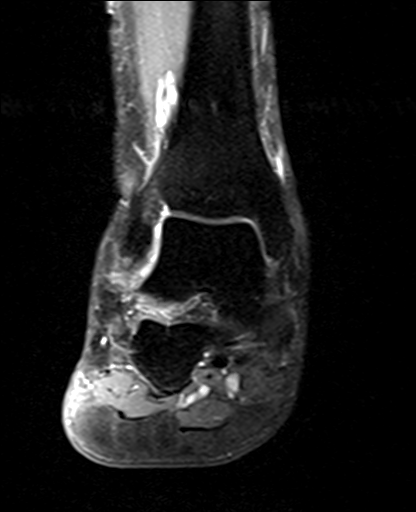
[im 28/37]
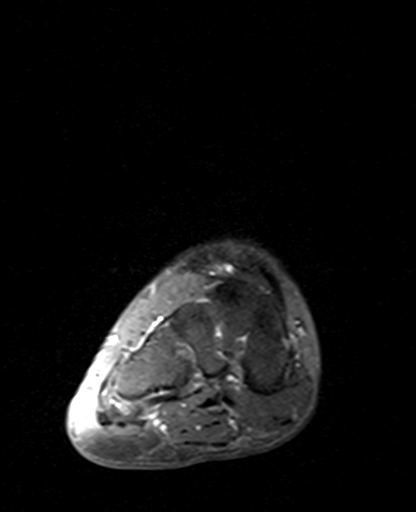
[im 37/37]
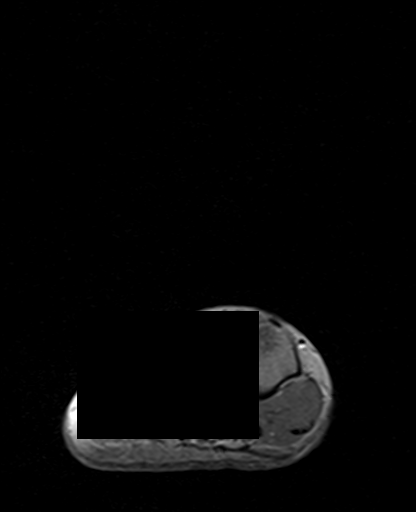

[40 of 40 positions shown; findings below may reference images not displayed]

FINDINGS: TENDONS

Peroneal: Intact peroneus longus and peroneus brevis tendons.

Posteromedial: Minimal tenosynovitis of the posterior tibial tendon
above the medial malleolus. The other flexor tendons are
unremarkable.

Anterior: Intact tibialis anterior, extensor hallucis longus and
extensor digitorum longus tendons.

Achilles: Intact.

Plantar Fascia: Intact.

LIGAMENTS

Lateral: Anterior talofibular ligament intact. Calcaneofibular
ligament intact. Posterior talofibular ligament intact. Anterior and
posterior tibiofibular ligaments intact.

Medial: Deltoid ligament intact. Spring ligament intact.

CARTILAGE

Ankle Joint: No joint effusion. Normal ankle mortise. No chondral
defect.

Subtalar Joints/Sinus Tarsi: Normal subtalar joints. No subtalar
joint effusion. Normal sinus tarsi.

Bones: No marrow signal abnormality. No fracture or dislocation. Os
navicularis.

Soft Tissue: No fluid collection or hematoma. Muscles are normal
without edema or atrophy. Tarsal tunnel is normal.
IMPRESSION: Minimal tenosynovitis of the posterior tibial tendon above the
medial malleolus. No acute tendon tear.

Intact ankle ligaments.  No acute osseous abnormality.

## 2021-07-12 MED ORDER — GADOBENATE DIMEGLUMINE 529 MG/ML IV SOLN
16.0000 mL | Freq: Once | INTRAVENOUS | Status: AC | PRN
  Administered 2021-07-12: 16:00:00 16 mL via INTRAVENOUS

## 2021-07-17 ENCOUNTER — Ambulatory Visit (INDEPENDENT_AMBULATORY_CARE_PROVIDER_SITE_OTHER): Payer: Medicaid Other | Admitting: Podiatry

## 2021-07-17 ENCOUNTER — Other Ambulatory Visit: Payer: Self-pay

## 2021-07-17 DIAGNOSIS — G5762 Lesion of plantar nerve, left lower limb: Secondary | ICD-10-CM

## 2021-07-17 DIAGNOSIS — G6289 Other specified polyneuropathies: Secondary | ICD-10-CM

## 2021-07-17 DIAGNOSIS — G5753 Tarsal tunnel syndrome, bilateral lower limbs: Secondary | ICD-10-CM

## 2021-07-17 DIAGNOSIS — G5751 Tarsal tunnel syndrome, right lower limb: Secondary | ICD-10-CM

## 2021-07-19 NOTE — Progress Notes (Signed)
°  Subjective:  Patient ID: Nichole Barber, female    DOB: 16-Feb-1976,  MRN: [id number]  Chief Complaint  Patient presents with   Peripheral Neuropathy    MRI results    45 y.o. female presents with the above complaint. History confirmed with patient.  Still feels about the same she completed the MRI  Objective:  Physical Exam: warm, good capillary refill, no trophic changes or ulcerative lesions, normal DP and PT pulses, and subjective paresthesias and numbness, positive Tinel's sign in the tarsal tunnel radiates into both feet the right is worse than the left.  Positive straight leg raise test.   Radiographs: Multiple views x-ray of both feet: no fracture, dislocation, swelling or degenerative changes noted, mild pes planus deformity  IMPRESSION: Minimal tenosynovitis of the posterior tibial tendon above and below the medial malleolus.   Short segment cleft within the posterior tibial tendon just distal to the myotendinous junction above the malleolus, which could represent a small focal split tear versus anatomic variation. The tendon is intact distally.   Intact ankle ligaments.  No acute osseous abnormality.     Electronically Signed   By: Maurine Simmering M.D.   On: 07/13/2021 13:07  Right heel IMPRESSION: Minimal tenosynovitis of the posterior tibial tendon above the medial malleolus. No acute tendon tear.   Intact ankle ligaments.  No acute osseous abnormality.     Electronically Signed   By: Maurine Simmering M.D.   On: 07/13/2021 12:55 Assessment:   1. Tarsal tunnel syndrome, bilateral   2. Other polyneuropathy       Plan:  Patient was evaluated and treated and all questions answered.  I reviewed the MRI images and findings with her in detail.  On the left side there is a finding of tenosynovitis of the PT tendon as well as a small possible split tear.  Her symptoms do not match with this that she has good 5 out of 5 strength without pain and is able to do  single and double heel rises.  Still think is largely radicular in nature.  In there is no abnormalities within the tarsal tunnel that would explain tarsal tunnel syndrome such as a mass that I could excise.  I recommend we continue and proceed with the EMG/NCV that she has scheduled for January.  I will discuss further options with her after this.  Return for i will call after nerve testing results .

## 2021-08-04 ENCOUNTER — Other Ambulatory Visit: Payer: Self-pay

## 2021-08-04 DIAGNOSIS — R202 Paresthesia of skin: Secondary | ICD-10-CM

## 2021-08-07 ENCOUNTER — Ambulatory Visit: Payer: Medicaid Other | Admitting: Neurology

## 2021-08-07 ENCOUNTER — Other Ambulatory Visit: Payer: Self-pay

## 2021-08-07 DIAGNOSIS — G6289 Other specified polyneuropathies: Secondary | ICD-10-CM | POA: Diagnosis not present

## 2021-08-07 DIAGNOSIS — R202 Paresthesia of skin: Secondary | ICD-10-CM

## 2021-08-07 DIAGNOSIS — G5753 Tarsal tunnel syndrome, bilateral lower limbs: Secondary | ICD-10-CM

## 2021-08-07 NOTE — Procedures (Signed)
[facility]  [address]  [place], [place] [address] Tel: [phone] Fax:  [phone] Test Date:  08/07/2021  Patient: Nichole Barber DOB: [birth year] Physician: Narda Amber, DO  Sex: Female Height: 5\' 5"  Ref Phys: Lanae Crumbly, DPM  ID#: [id number]   Technician:    Patient Complaints: This is a 46 year old female with bilateral feet pain referred for evaluation of tarsal tunnel syndrome.  NCV & EMG Findings: Extensive electrodiagnostic testing of the right lower extremity and additional studies of the left shows:  Bilateral sural and superficial peroneal sensory responses are within normal limits.  Bilateral medial and lateral plantar sensory responses are absent; in the absence of associated needle or tibial nerve abnormalities, these findings are normal for patient's age. Bilateral peroneal and tibial motor responses are within normal limits. Bilateral tibial H reflex studies are within normal limits. There is no evidence of active or chronic motor axonal loss changes affecting any of the tested muscles.  Motor unit configuration and recruitment pattern is within normal limits.  Impression: The electrophysiologic findings are within normal limits.  There is no definite evidence of tarsal tunnel syndrome, sensorimotor polyneuropathy, or lumbosacral radiculopathy affecting either lower extremity.   ___________________________ Narda Amber, DO    Nerve Conduction Studies Anti Sensory Summary Table   Stim Site NR Peak (ms) Norm Peak (ms) P-T Amp (V) Norm P-T Amp  Left Sup Peroneal Anti Sensory (Ant Lat Mall)  32C  12 cm    2.3 <4.5 14.3 >5  Right Sup Peroneal Anti Sensory (Ant Lat Mall)  32C  12 cm    2.3 <4.5 19.2 >5  Left Sural Anti Sensory (Lat Mall)  32C  Calf    2.9 <4.5 17.7 >5  Right Sural Anti Sensory (Lat Mall)  32C  Calf    2.0 <4.5 22.9 >5   Motor Summary Table   Stim Site NR Onset (ms) Norm Onset (ms) O-P Amp (mV) Norm O-P Amp  Site1 Site2 Delta-0 (ms) Dist (cm) Vel (m/s) Norm Vel (m/s)  Left Peroneal Motor (Ext Dig Brev)  32C  Ankle    3.0 <5.5 6.6 >3 B Fib Ankle 7.0 37.0 53 >40  B Fib    10.0  6.4  Poplt B Fib 1.6 7.0 44 >40  Poplt    11.6  6.3         Right Peroneal Motor (Ext Dig Brev)  32C  Ankle    2.0 <5.5 10.1 >3 B Fib Ankle 7.0 37.0 53 >40  B Fib    9.0  9.0  Poplt B Fib 1.5 8.0 53 >40  Poplt    10.5  8.8         Left Tibial Motor (Abd Hall Brev)  32C  Ankle    3.9 <6.0 8.3 >8 Knee Ankle 8.1 40.0 49 >40  Knee    12.0  7.1         Right Tibial Motor (Abd Hall Brev)  32C  Ankle    4.5 <6.0 9.3 >8 Knee Ankle 8.2 43.0 52 >40  Knee    12.7  9.2         Left Tibial (ADQP) Motor (ADQP)  32C  Ankle    3.9 <6.5 6.0 >4        Right Tibial (ADQP) Motor (ADQP)  32C  Ankle    4.1 <6.5 4.3 >4         Mixed Summary Table   Stim Site NR Peak (ms)  Norm Peak (ms) P-T Amp (V) Norm P-T Amp  Left Lateral Plantar Mixed (Med Malleolus)  32C  Lateral Foot NR  <3.7  >8  Right Lateral Plantar Mixed (Med Malleolus)  32C  Lateral Foot NR  <3.7  >8  Left Medial Plantar Mixed (Med Malleolus)  32C  Medial Foot NR  <3.7  >8  Right Medial Plantar Mixed (Med Malleolus)  32C  Medial Foot NR  <3.7  >8   H Reflex Studies   NR H-Lat (ms) Lat Norm (ms) L-R H-Lat (ms)  Left Tibial (Gastroc)  32C     30.75 <35 0.00  Right Tibial (Gastroc)  32C     30.75 <35 0.00   EMG   Side Muscle Ins Act Fibs Psw Fasc Number Recrt Dur Dur. Amp Amp. Poly Poly. Comment  Right AntTibialis Nml Nml Nml Nml Nml Nml Nml Nml Nml Nml Nml Nml N/A  Right Gastroc Nml Nml Nml Nml Nml Nml Nml Nml Nml Nml Nml Nml N/A  Right Flex Dig Long Nml Nml Nml Nml Nml Nml Nml Nml Nml Nml Nml Nml N/A  Right AbdHallucis Nml Nml Nml Nml Nml Nml Nml Nml Nml Nml Nml Nml N/A  Right RectFemoris Nml Nml Nml Nml Nml Nml Nml Nml Nml Nml Nml Nml N/A  Right BicepsFemS Nml Nml Nml Nml Nml Nml Nml Nml Nml Nml Nml Nml N/A  Right AbdDigQuinti Nml Nml Nml Nml Nml Nml  Nml Nml Nml Nml Nml Nml N/A  Left BicepsFemS Nml Nml Nml Nml Nml Nml Nml Nml Nml Nml Nml Nml N/A  Left AbdDigQuinti Nml Nml Nml Nml Nml Nml Nml Nml Nml Nml Nml Nml N/A  Left AntTibialis Nml Nml Nml Nml Nml Nml Nml Nml Nml Nml Nml Nml N/A  Left Gastroc Nml Nml Nml Nml Nml Nml Nml Nml Nml Nml Nml Nml N/A  Left Flex Dig Long Nml Nml Nml Nml Nml Nml Nml Nml Nml Nml Nml Nml N/A  Left AbdHallucis Nml Nml Nml Nml Nml Nml Nml Nml Nml Nml Nml Nml N/A  Left RectFemoris Nml Nml Nml Nml Nml Nml Nml Nml Nml Nml Nml Nml N/A      Waveforms:

## 2022-03-19 ENCOUNTER — Encounter (HOSPITAL_COMMUNITY): Payer: Self-pay

## 2022-03-19 ENCOUNTER — Ambulatory Visit (HOSPITAL_COMMUNITY)
Admission: EM | Admit: 2022-03-19 | Discharge: 2022-03-19 | Disposition: A | Discharge status: Home / Self Care | Payer: Medicaid Other | Attending: Family Medicine | Admitting: Family Medicine

## 2022-03-19 DIAGNOSIS — Z1152 Encounter for screening for COVID-19: Secondary | ICD-10-CM | POA: Insufficient documentation

## 2022-03-19 DIAGNOSIS — Z0189 Encounter for other specified special examinations: Secondary | ICD-10-CM | POA: Diagnosis not present

## 2022-03-19 NOTE — ED Triage Notes (Addendum)
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 States her son is sick and wants to be sure they do not have COVID. Patient not having any sick symptoms currently.

## 2022-03-19 NOTE — ED Provider Notes (Signed)
[facility]    CSN: [id number] Arrival date & time: 03/19/22  1445      History   Chief Complaint Chief Complaint  Patient presents with   Covid Test    HPI Nichole Barber is a 46 y.o. female.   Patient presents today requesting COVID testing.  She denies any current symptoms including cough, congestion, fever, nausea, vomiting.  Reports that her son has been sick and so she is interested in having both him and herself tested for COVID.  She did miss work and is requesting work excuse note.  She has had COVID before with last episode within the past year.  She has had COVID-19 vaccines.  She does have several risk factors for complications including asthma and smoking.    Past Medical History:  Diagnosis Date   Anemia    Asthma    Bronchitis     Patient Active Problem List   Diagnosis Date Noted   Bilateral elbow joint pain 09/24/2020   Hypochromic microcytic anemia 10/08/2018   Pain in right knee 05/09/2018   Pain in both hands 07/05/2016   General medical exam 07/15/2015   Abnormal uterine bleeding 02/13/2013   Displacement of lumbar intervertebral disc 05/02/2011   Low back pain 05/02/2011    Past Surgical History:  Procedure Laterality Date   CARPAL TUNNEL RELEASE     CESAREAN SECTION     HERNIA REPAIR     KNEE SURGERY      OB History   No obstetric history on file.      Home Medications    Prior to Admission medications   Medication Sig Start Date End Date Taking? Authorizing Provider  albuterol (VENTOLIN HFA) 108 (90 Base) MCG/ACT inhaler Frequency:PHARMDIR   Dosage:90   MCG  Instructions:  Note:used inhaler as needed daily for asthma attacks Dose: 90 MCG 08/08/12   [provider]  beclomethasone (QVAR) 40 MCG/ACT inhaler Frequency:PHARMDIR   Dosage:40   MCG  Instructions:  Note:use q var inhaler twice daily; if dose is lower than home dose please provide 80 mcg inhaler Dose: 40 MCG 08/08/12   [provider]   Cetirizine HCl 10 MG CAPS 10 mg. 11/22/11   [provider]  cyclobenzaprine (FLEXERIL) 10 MG tablet Take 1 tablet (10 mg total) by mouth 2 (two) times daily as needed for muscle spasms. 05/29/21   White, Leitha Schuller, NP  ferrous sulfate (EQL SLOW RELEASE IRON) 160 (50 Fe) MG TBCR SR tablet Take 1 tablet (160 mg total) by mouth daily. 02/22/17   Earleen Newport, MD  ferrous sulfate 325 (65 FE) MG tablet Take 325 mg by mouth daily. 02/08/21   [provider]  Fluocinolone Acetonide Body 0.01 % OIL 0.01 %. 09/29/13   [provider]  fluticasone (FLONASE) 50 MCG/ACT nasal spray FLUTICASONE PROPIONATE 50 MCG/ACT SUSP 08/04/13   [provider]  gabapentin (NEURONTIN) 300 MG capsule Take by mouth. 04/15/21   [provider]  ibuprofen (ADVIL,MOTRIN) 800 MG tablet Take 1 tablet (800 mg total) by mouth every 8 (eight) hours as needed. 10/05/15   Beers, Pierce Crane, PA-C  lactulose (CHRONULAC) 10 GM/15ML solution Take 45 mLs (30 g total) by mouth daily. 03/09/21   Hans Eden, NP  meloxicam (MOBIC) 15 MG tablet Take 1 tablet (15 mg total) by mouth daily. 05/29/21   White, Leitha Schuller, NP  montelukast (SINGULAIR) 10 MG tablet 10 mg. 11/10/14   [provider]  mupirocin cream Drue Stager)  2 % 2 %. 06/18/15   [provider]  naproxen (NAPROSYN) 500 MG tablet Take 500 mg by mouth 2 (two) times daily as needed. 02/17/21   [provider]  nitroGLYCERIN (NITRODUR - DOSED IN MG/24 HR) 0.2 mg/hr patch Use 1/4 patch daily to the right elbow. 09/23/20   Dickie La, MD  omeprazole (PRILOSEC) 40 MG capsule 40 mg. 11/22/11   [provider]  ondansetron (ZOFRAN ODT) 8 MG disintegrating tablet Take 1 tablet (8 mg total) by mouth every 8 (eight) hours as needed for nausea or vomiting. Patient not taking: No sig reported 05/02/21   Hazel Sams, PA-C  predniSONE (DELTASONE) 10 MG tablet PLEASE SEE ATTACHED FOR DETAILED DIRECTIONS 05/09/21    [provider]  Respiratory Therapy Supplies (PARI BABY CONVERSION KIT) [place] NEBULIZER 06/29/11   [provider]  SUMAtriptan (IMITREX) 25 MG tablet 25 mg. 09/17/13   [provider]    Family History History reviewed. No pertinent family history.  Social History Social History   Tobacco Use   Smoking status: Every Day    Types: Cigarettes   Smokeless tobacco: Never  Vaping Use   Vaping Use: Never used  Substance Use Topics   Alcohol use: No   Drug use: No     Allergies   Amoxicillin-pot clavulanate, Feraheme [ferumoxytol], Metronidazole, and Pineapple   Review of Systems Review of Systems  Constitutional:  Negative for activity change, appetite change, fatigue and fever.  HENT:  Negative for congestion, sinus pressure, sneezing and sore throat.   Respiratory:  Negative for cough and shortness of breath.   Cardiovascular:  Negative for chest pain.  Gastrointestinal:  Negative for abdominal pain, diarrhea, nausea and vomiting.  Neurological:  Negative for dizziness, light-headedness and headaches.     Physical Exam Triage Vital Signs ED Triage Vitals  Enc Vitals Group     BP 03/19/22 1512 (!) 143/94     Pulse Rate 03/19/22 1512 86     Resp 03/19/22 1512 16     Temp 03/19/22 1512 99.2 F (37.3 C)     Temp Source 03/19/22 1512 Oral     SpO2 03/19/22 1512 97 %     Weight 03/19/22 1514 169 lb 15.6 oz (77.1 kg)     Height 03/19/22 1514 _0  (1.651 m)     Head Circumference --      Peak Flow --      Pain Score 03/19/22 1514 0     Pain Loc --      Pain Edu? --      Excl. in [place]? --    No data found.  Updated Vital Signs BP (!) 143/94 (BP Location: Left Arm)   Pulse 86   Temp 99.2 F (37.3 C) (Oral)   Resp 16   Ht _1  (1.651 m)   Wt 169 lb 15.6 oz (77.1 kg)   LMP 02/16/2022 (Approximate)   SpO2 97%   BMI 28.29 kg/m   Visual Acuity Right Eye Distance:   Left Eye Distance:   Bilateral Distance:    Right Eye Near:   Left  Eye Near:    Bilateral Near:     Physical Exam Vitals reviewed.  Constitutional:      General: She is awake. She is not in acute distress.    Appearance: Normal appearance. She is well-developed. She is not ill-appearing.     Comments: Very pleasant female appears stated age in no acute distress sitting comfortably  in exam room  HENT:     Head: Normocephalic and atraumatic.     Mouth/Throat:     Mouth: Mucous membranes are moist.     Pharynx: Uvula midline. No oropharyngeal exudate or posterior oropharyngeal erythema.  Cardiovascular:     Rate and Rhythm: Normal rate and regular rhythm.     Heart sounds: Normal heart sounds, S1 normal and S2 normal. No murmur heard. Pulmonary:     Effort: Pulmonary effort is normal.     Breath sounds: Normal breath sounds. No wheezing, rhonchi or rales.     Comments: Clear to auscultation bilaterally Psychiatric:        Behavior: Behavior is cooperative.      UC Treatments / Results  Labs (all labs ordered are listed, but only abnormal results are displayed) Labs Reviewed  SARS CORONAVIRUS 2 (TAT 6-24 HRS)    EKG   Radiology No results found.  Procedures Procedures (including critical care time)  Medications Ordered in UC Medications - No data to display  Initial Impression / Assessment and Plan / UC Course  I have reviewed the triage vital signs and the nursing notes.  Pertinent labs & imaging results that were available during my care of the patient were reviewed by me and considered in my medical decision making (see chart for details).     Patient is well-appearing, afebrile, nontoxic, nontachycardic.  COVID testing was obtained given household sick contacts.  We will contact her if her test is positive.  Given her history of smoking and asthma she is a candidate for antiviral therapy.  Her last metabolic panel available in care everywhere on 12/04/2021 showed estimated GFR of greater than 90.  She does not need to have any of  her medications held or stopped based on today's list if she requires Paxlovid.  She is to rest and drink plenty of fluid.  Recommended that she return if she develops any symptoms or has any concerns.  Final Clinical Impressions(s) / UC Diagnoses   Final diagnoses:  Encounter for screening for COVID-19     Discharge Instructions      We will contact you if your test is positive.  If you develop any symptoms please return for reevaluation.  Rest and drink plenty of fluid.    ED Prescriptions   None    PDMP not reviewed this encounter.   Terrilee Croak, PA-C 03/19/22 1533

## 2022-03-19 NOTE — Discharge Instructions (Signed)
We will contact you if your test is positive.  If you develop any symptoms please return for reevaluation.  Rest and drink plenty of fluid.

## 2022-03-20 LAB — SARS CORONAVIRUS 2 (TAT 6-24 HRS): SARS Coronavirus 2: NEGATIVE

## 2022-10-20 ENCOUNTER — Emergency Department (HOSPITAL_COMMUNITY): Payer: Medicaid Other

## 2022-10-20 ENCOUNTER — Encounter (HOSPITAL_COMMUNITY): Payer: Self-pay | Admitting: *Deleted

## 2022-10-20 ENCOUNTER — Other Ambulatory Visit: Payer: Self-pay

## 2022-10-20 ENCOUNTER — Emergency Department (HOSPITAL_COMMUNITY)
Admission: EM | Admit: 2022-10-20 | Discharge: 2022-10-21 | Disposition: A | Discharge status: Home / Self Care | Payer: Medicaid Other | Attending: Emergency Medicine | Admitting: Emergency Medicine

## 2022-10-20 DIAGNOSIS — J45909 Unspecified asthma, uncomplicated: Secondary | ICD-10-CM | POA: Diagnosis not present

## 2022-10-20 DIAGNOSIS — R0789 Other chest pain: Secondary | ICD-10-CM

## 2022-10-20 LAB — I-STAT BETA HCG BLOOD, ED (MC, WL, AP ONLY): I-stat hCG, quantitative: <5 m[IU]/mL (ref <5)

## 2022-10-20 LAB — CBC
HCT: 47.7 % — ABNORMAL HIGH (ref 36.0–46.0)
Hemoglobin: 17 g/dL — ABNORMAL HIGH (ref 12.0–15.0)
MCH: 33.6 pg (ref 26.0–34.0)
MCHC: 35.6 g/dL (ref 30.0–36.0)
MCV: 94.3 fL (ref 80.0–100.0)
Platelets: 394 10*3/uL (ref 150–400)
RBC: 5.06 MIL/uL (ref 3.87–5.11)
RDW: 13.6 % (ref 11.5–15.5)
WBC: 6.9 10*3/uL (ref 4.0–10.5)
nRBC: 0 % (ref 0.0–0.2)

## 2022-10-20 LAB — BASIC METABOLIC PANEL
Anion gap: 8 (ref 5–15)
BUN: 8 mg/dL (ref 6–20)
CO2: 27 mmol/L (ref 22–32)
Calcium: 9 mg/dL (ref 8.9–10.3)
Chloride: 102 mmol/L (ref 98–111)
Creatinine, Ser: 0.68 mg/dL (ref 0.44–1.00)
GFR, Estimated: >60 mL/min (ref >60)
Glucose, Bld: 114 mg/dL — ABNORMAL HIGH (ref 70–99)
Potassium: 3.5 mmol/L (ref 3.5–5.1)
Sodium: 137 mmol/L (ref 135–145)

## 2022-10-20 LAB — TROPONIN I (HIGH SENSITIVITY): Troponin I (High Sensitivity): 3 ng/L (ref <18)

## 2022-10-20 NOTE — ED Triage Notes (Signed)
The pt is having upper  chest pain for 24 hours  lmp none

## 2022-10-20 NOTE — ED Notes (Signed)
Patient transported to X-ray 

## 2022-10-21 MED ORDER — ACETAMINOPHEN 500 MG PO TABS
1000.0000 mg | ORAL_TABLET | ORAL | Status: AC
  Administered 2022-10-21: 1000 mg via ORAL
  Filled 2022-10-21: qty 2

## 2022-10-21 MED ORDER — PANTOPRAZOLE SODIUM 40 MG PO TBEC
40.0000 mg | DELAYED_RELEASE_TABLET | Freq: Once | ORAL | Status: AC
  Administered 2022-10-21: 40 mg via ORAL
  Filled 2022-10-21: qty 1

## 2022-10-21 NOTE — Discharge Instructions (Signed)
I have given you the information for cardiologist office to follow-up with if you would like to.  I think this is a reasonable thing to do however if your symptoms do not recur then I recommend following up with your primary care doctor.  Take Tylenol 1000 mg every 6 hours as needed for pain.  I recommend taking a dose 6 hours from now after that you can wait and see how you feel.  Please return to the emergency room immediately for any new or concerning symptoms as we discussed.

## 2022-10-21 NOTE — ED Provider Notes (Signed)
[facility] Provider Note   CSN: [id number] Arrival date & time: 10/20/22  2246     History  Chief Complaint  Patient presents with   Chest Pain    Nichole Barber is a 47 y.o. female.   Chest Pain Patient is a 47 year old female past medical history significant for anemia asthma, and reflux   Patient is presenting to the ED for chest pain. She states she noticed the pain today around 3:00 pm after waking up from a nap. Patient describes it as a constant pain that is worse with movement and inspiration. Patient states the pain does not radiate. Patient states she does not have a history of chest pain. She denies any fevers, chills, nasal congestion, cough, shortness of breath, nausea or vomiting. Patient has a past medical history of GERD and has omeprazole at home.  Patient denies any trauma to her chest.   No recent surgeries, hospitalization, long travel, hemoptysis, estrogen containing OCP, cancer history.  No unilateral leg swelling.  No history of PE or VTE.      Home Medications Prior to Admission medications   Medication Sig Start Date End Date Taking? Authorizing Provider  albuterol (VENTOLIN HFA) 108 (90 Base) MCG/ACT inhaler Frequency:PHARMDIR   Dosage:90   MCG  Instructions:  Note:used inhaler as needed daily for asthma attacks Dose: 90 MCG 08/08/12   [provider]  beclomethasone (QVAR) 40 MCG/ACT inhaler Frequency:PHARMDIR   Dosage:40   MCG  Instructions:  Note:use q var inhaler twice daily; if dose is lower than home dose please provide 80 mcg inhaler Dose: 40 MCG 08/08/12   [provider]  Cetirizine HCl 10 MG CAPS 10 mg. 11/22/11   [provider]  cyclobenzaprine (FLEXERIL) 10 MG tablet Take 1 tablet (10 mg total) by mouth 2 (two) times daily as needed for muscle spasms. 05/29/21   White, Leitha Schuller, NP  ferrous sulfate (EQL SLOW RELEASE IRON) 160 (50 Fe) MG TBCR SR tablet Take 1 tablet  (160 mg total) by mouth daily. 02/22/17   Earleen Newport, MD  ferrous sulfate 325 (65 FE) MG tablet Take 325 mg by mouth daily. 02/08/21   [provider]  Fluocinolone Acetonide Body 0.01 % OIL 0.01 %. 09/29/13   [provider]  fluticasone (FLONASE) 50 MCG/ACT nasal spray FLUTICASONE PROPIONATE 50 MCG/ACT SUSP 08/04/13   [provider]  gabapentin (NEURONTIN) 300 MG capsule Take by mouth. 04/15/21   [provider]  ibuprofen (ADVIL,MOTRIN) 800 MG tablet Take 1 tablet (800 mg total) by mouth every 8 (eight) hours as needed. 10/05/15   Beers, Pierce Crane, PA-C  lactulose (CHRONULAC) 10 GM/15ML solution Take 45 mLs (30 g total) by mouth daily. 03/09/21   Hans Eden, NP  meloxicam (MOBIC) 15 MG tablet Take 1 tablet (15 mg total) by mouth daily. 05/29/21   White, Leitha Schuller, NP  montelukast (SINGULAIR) 10 MG tablet 10 mg. 11/10/14   [provider]  mupirocin cream (BACTROBAN) 2 % 2 %. 06/18/15   [provider]  naproxen (NAPROSYN) 500 MG tablet Take 500 mg by mouth 2 (two) times daily as needed. 02/17/21   [provider]  nitroGLYCERIN (NITRODUR - DOSED IN MG/24 HR) 0.2 mg/hr patch Use 1/4 patch daily to the right elbow. 09/23/20   Dickie La, MD  omeprazole (PRILOSEC) 40 MG capsule 40 mg. 11/22/11   [provider]  ondansetron (ZOFRAN ODT) 8 MG disintegrating  tablet Take 1 tablet (8 mg total) by mouth every 8 (eight) hours as needed for nausea or vomiting. Patient not taking: No sig reported 05/02/21   Hazel Sams, PA-C  predniSONE (DELTASONE) 10 MG tablet PLEASE SEE ATTACHED FOR DETAILED DIRECTIONS 05/09/21   [provider]  Respiratory Therapy Supplies (PARI BABY CONVERSION KIT) [place] NEBULIZER 06/29/11   [provider]  SUMAtriptan (IMITREX) 25 MG tablet 25 mg. 09/17/13   [provider]      Allergies    Amoxicillin-pot clavulanate, Feraheme [ferumoxytol], Metronidazole, and Pineapple     Review of Systems   Review of Systems  Cardiovascular:  Positive for chest pain.    Physical Exam Updated Vital Signs BP (!) 161/93   Pulse 71   Temp 98 F (36.7 C)   Resp (!) 24   Ht 5\' 5"  (1.651 m)   Wt 77.1 kg   SpO2 96%   BMI 28.29 kg/m  Physical Exam Vitals and nursing note reviewed.  Constitutional:      General: She is not in acute distress.    Comments: Pleasant well-appearing 47 year old.  In no acute distress.  Sitting in bed.  Able answer questions appropriately follow commands. No increased work of breathing. Speaking in full sentences.   HENT:     Head: Normocephalic and atraumatic.     Nose: Nose normal.  Eyes:     General: No scleral icterus. Cardiovascular:     Rate and Rhythm: Normal rate and regular rhythm.     Pulses: Normal pulses.     Heart sounds: Normal heart sounds.  Pulmonary:     Effort: Pulmonary effort is normal. No respiratory distress.     Breath sounds: No wheezing.  Chest:     Chest wall: Tenderness present.  Abdominal:     Palpations: Abdomen is soft.     Tenderness: There is no abdominal tenderness.  Musculoskeletal:     Cervical back: Normal range of motion.     Right lower leg: No edema.     Left lower leg: No edema.     Comments: Bilateral legs without calf tenderness or swelling  Skin:    General: Skin is warm and dry.     Capillary Refill: Capillary refill takes less than 2 seconds.  Neurological:     Mental Status: She is alert. Mental status is at baseline.  Psychiatric:        Mood and Affect: Mood normal.        Behavior: Behavior normal.     ED Results / Procedures / Treatments   Labs (all labs ordered are listed, but only abnormal results are displayed) Labs Reviewed  BASIC METABOLIC PANEL - Abnormal; Notable for the following components:      Result Value   Glucose, Bld 114 (*)    All other components within normal limits  CBC - Abnormal; Notable for the following components:   Hemoglobin 17.0 (*)     HCT 47.7 (*)    All other components within normal limits  I-STAT BETA HCG BLOOD, ED ([facility], [facility], [facility] ONLY)  TROPONIN I (HIGH SENSITIVITY)    EKG EKG Interpretation  Date/Time:  Saturday October 20 2022 22:42:25 EDT Ventricular Rate:  79 PR Interval:  134 QRS Duration: 70 QT Interval:  378 QTC Calculation: 433 R Axis:   51 Text Interpretation: Normal sinus rhythm ST & T wave abnormality, consider inferolateral ischemia Abnormal ECG When compared with ECG of 03-Dec-2009 13:13, ST-t abnormality is now  present Confirmed by Delora Fuel ([id number]) on 10/21/2022 12:10:58 AM  Radiology DG Chest 2 View  Result Date: 10/20/2022 CLINICAL DATA:  Chest pain EXAM: CHEST - 2 VIEW COMPARISON:  None Available. FINDINGS: The heart size and mediastinal contours are within normal limits. Both lungs are clear. The visualized skeletal structures are unremarkable. IMPRESSION: No active cardiopulmonary disease. Electronically Signed   By: Inez Catalina M.D.   On: 10/20/2022 23:52    Procedures Procedures    Medications Ordered in ED Medications  acetaminophen (TYLENOL) tablet 1,000 mg (1,000 mg Oral Given 10/21/22 0042)  pantoprazole (PROTONIX) EC tablet 40 mg (40 mg Oral Given 10/21/22 0042)    ED Course/ Medical Decision Making/ A&P Clinical Course as of 10/21/22 0214  Sun Oct 21, 2022  0016 3pm [WF]    Clinical Course User Index [WF] Tedd Sias, [place]                             Medical Decision Making Amount and/or Complexity of Data Reviewed Labs: ordered. Radiology: ordered.  Risk OTC drugs. Prescription drug management.    This patient presents to the ED for concern of chest pain, this involves a number of treatment options, and is a complaint that carries with it a high risk of complications and morbidity. A differential diagnosis was considered for the patient's symptoms which is discussed below:   The emergent causes of chest pain include: Acute coronary syndrome, tamponade,  pericarditis/myocarditis, aortic dissection, pulmonary embolism, tension pneumothorax, pneumonia, and esophageal rupture.   I do not believe the patient has an emergent cause of chest pain, other urgent/non-acute considerations include, but are not limited to: chronic angina, aortic stenosis, cardiomyopathy, mitral valve prolapse, pulmonary hypertension, aortic insufficiency, right ventricular hypertrophy, pleuritis, bronchitis, pneumothorax, tumor, gastroesophageal reflux disease (GERD), esophageal spasm, Mallory-Weiss syndrome, peptic ulcer disease, pancreatitis, functional gastrointestinal pain, cervical or thoracic disk disease or arthritis, shoulder arthritis, costochondritis, subacromial bursitis, anxiety or panic attack, herpes zoster, breast disorders, chest wall tumors, thoracic outlet syndrome, mediastinitis.    Co morbidities: Discussed in HPI   Brief History:  Patient is a 47 year old female past medical history significant for anemia asthma, and reflux   Patient is presenting to the ED for chest pain. She states she noticed the pain today around 3:00 pm after waking up from a nap. Patient describes it as a constant pain that is worse with movement and inspiration. Patient states the pain does not radiate. Patient states she does not have a history of chest pain. She denies any fevers, chills, nasal congestion, cough, shortness of breath, nausea or vomiting. Patient has a past medical history of GERD and has omeprazole at home.  Patient denies any trauma to her chest.   No recent surgeries, hospitalization, long travel, hemoptysis, estrogen containing OCP, cancer history.  No unilateral leg swelling.  No history of PE or VTE.    EMR reviewed including pt PMHx, past surgical history and past visits to ER.   See HPI for more details   Lab Tests:   I personally reviewed all laboratory work and imaging. Metabolic panel without any acute abnormality specifically kidney function  within normal limits and no significant electrolyte abnormalities. CBC without leukocytosis or significant anemia. Troponin x 1 within normal limits.  Patient has had pain persistently for greater than 6 hours without change.  Imaging Studies:  NAD. I personally reviewed all imaging studies and no acute abnormality found.  I agree with radiology interpretation.    Cardiac Monitoring:  The patient was maintained on a cardiac monitor.  I personally viewed and interpreted the cardiac monitored which showed an underlying rhythm of: NSR EKG non-ischemic   Medicines ordered:  I ordered medication including Tylenol, Protonix for pain Reevaluation of the patient after these medicines showed that the patient resolved I have reviewed the patients home medicines and have made adjustments as needed   Critical Interventions:     Consults/Attending Physician      Reevaluation:  After the interventions noted above I re-evaluated patient and found that they have :resolved   Social Determinants of Health:      Problem List / ED Course:  Chest pain please be constant for almost 12 hours at this point.  I doubt ACS with normal troponin.  EKG is benign without evidence acute ischemia.  He does have some T wave inversions in 2 of her inferior leads which I do not appreciate from her nearly decade old EKG prior.  Her presentation however is fairly consistent with musculoskeletal pain and her chest pain is somewhat reproducible and does not also seem to be very much affected by certain movements although it is not exertional or does not seem to be pleuritic.  She is PERC negative and overall well-appearing.  Her symptoms completely resolved with the Tylenol and Protonix p.o. dose.  Follow-up with cardiology outpatient and return to emergency room for any new or concerning symptoms   Dispostion:  After consideration of the diagnostic results and the patients response to treatment, I feel  that the patent would benefit from outpatient follow-up       Final Clinical Impression(s) / ED Diagnoses Final diagnoses:  Atypical chest pain    Rx / DC Orders ED Discharge Orders     None         Tedd Sias, [place] [id number]    Delora Fuel, [place] [id number]

## 2022-11-12 NOTE — Progress Notes (Deleted)
Cardiology Office Note:    Date:  11/12/2022   ID:  Nichole Barber, DOB 13-Jun-1976, MRN [id number]  PCP:  [facility] Providers Cardiologist:  None { Click to update primary MD,subspecialty MD or APP then REFRESH:1}    Referring MD: [facility]   No chief complaint on file. ***  History of Present Illness:    Nichole Barber is a 47 y.o. female with a hx of asthma, referral from the [facility] ED 10/20/2022 for  CP. She has no cardiac risk factors. Per the ED notes: She states she noticed the pain today around 3:00 pm after waking up from a nap. Patient describes it as a constant pain that is worse with movement and inspiration. Patient states the pain does not radiate. Patient states she does not have a history of chest pain. She denies any fevers, chills, nasal congestion, cough, shortness of breath, nausea or vomiting. Patient has a past medical history of GERD and has omeprazole at home.   Past Medical History:  Diagnosis Date   Anemia    Asthma    Bronchitis     Past Surgical History:  Procedure Laterality Date   CARPAL TUNNEL RELEASE     CESAREAN SECTION     HERNIA REPAIR     KNEE SURGERY      Current Medications: No outpatient medications have been marked as taking for the 11/13/22 encounter (Appointment) with Maisie Fus, MD.     Allergies:   Amoxicillin-pot clavulanate, Feraheme [ferumoxytol], Metronidazole, and Pineapple   Social History   Socioeconomic History   Marital status: Single    Spouse name: Not on file   Number of children: Not on file   Years of education: Not on file   Highest education level: Not on file  Occupational History   Not on file  Tobacco Use   Smoking status: Every Day    Types: Cigarettes   Smokeless tobacco: Never  Vaping Use   Vaping Use: Never used  Substance and Sexual Activity   Alcohol use: No   Drug use: No   Sexual activity: Not on file  Other Topics  Concern   Not on file  Social History Narrative   ** Merged History Encounter **       Social Determinants of Health   Financial Resource Strain: Not on file  Food Insecurity: Not on file  Transportation Needs: Not on file  Physical Activity: Not on file  Stress: Not on file  Social Connections: Not on file     Family History: The patient's ***family history is not on file.  ROS:   Please see the history of present illness.    *** All other systems reviewed and are negative.  EKGs/Labs/Other Studies Reviewed:    The following studies were reviewed today: ***  EKG:  EKG is *** ordered today.  The ekg ordered today demonstrates ***  Recent Labs: 10/20/2022: BUN 8; Creatinine, Ser 0.68; Hemoglobin 17.0; Platelets 394; Potassium 3.5; Sodium 137  Recent Lipid Panel No results found for: "CHOL", "TRIG", "HDL", "CHOLHDL", "VLDL", "LDLCALC", "LDLDIRECT"   Risk Assessment/Calculations:   {Does this patient have ATRIAL FIBRILLATION?[phone]}  No BP recorded.  {Refresh Note OR Click here to enter BP  :1}***         Physical Exam:    VS:  There were no vitals taken for this visit.    Wt Readings from Last 3 Encounters:  10/20/22 169  lb 15.6 oz (77.1 kg)  03/19/22 169 lb 15.6 oz (77.1 kg)  09/23/20 170 lb (77.1 kg)     GEN: *** Well nourished, well developed in no acute distress HEENT: Normal NECK: No JVD; No carotid bruits LYMPHATICS: No lymphadenopathy CARDIAC: ***RRR, no murmurs, rubs, gallops RESPIRATORY:  Clear to auscultation without rales, wheezing or rhonchi  ABDOMEN: Soft, non-tender, non-distended MUSCULOSKELETAL:  No edema; No deformity  SKIN: Warm and dry NEUROLOGIC:  Alert and oriented x 3 PSYCHIATRIC:  Normal affect   ASSESSMENT:    No diagnosis found. PLAN:    In order of problems listed above:  ***      {Are you ordering a CV Procedure (e.g. stress test, cath, DCCV, TEE, etc)?   Press F2        :[id number]}    Medication  Adjustments/Labs and Tests Ordered: Current medicines are reviewed at length with the patient today.  Concerns regarding medicines are outlined above.  No orders of the defined types were placed in this encounter.  No orders of the defined types were placed in this encounter.   There are no Patient Instructions on file for this visit.   Signed, Maisie Fus, MD  11/12/2022 3:20 PM    [facility]

## 2022-11-13 ENCOUNTER — Ambulatory Visit: Payer: Medicaid Other | Attending: Internal Medicine | Admitting: Internal Medicine

## 2022-11-27 ENCOUNTER — Encounter: Payer: Self-pay | Admitting: *Deleted

## 2023-05-22 ENCOUNTER — Ambulatory Visit (HOSPITAL_COMMUNITY)
Admission: EM | Admit: 2023-05-22 | Discharge: 2023-05-22 | Disposition: A | Discharge status: Home / Self Care | Payer: Medicaid Other | Attending: Physician Assistant | Admitting: Physician Assistant

## 2023-05-22 ENCOUNTER — Encounter (HOSPITAL_COMMUNITY): Payer: Self-pay | Admitting: Emergency Medicine

## 2023-05-22 ENCOUNTER — Telehealth (HOSPITAL_COMMUNITY): Payer: Self-pay

## 2023-05-22 DIAGNOSIS — Z1152 Encounter for screening for COVID-19: Secondary | ICD-10-CM | POA: Insufficient documentation

## 2023-05-22 DIAGNOSIS — R059 Cough, unspecified: Secondary | ICD-10-CM | POA: Insufficient documentation

## 2023-05-22 DIAGNOSIS — J4521 Mild intermittent asthma with (acute) exacerbation: Secondary | ICD-10-CM

## 2023-05-22 DIAGNOSIS — J069 Acute upper respiratory infection, unspecified: Secondary | ICD-10-CM | POA: Diagnosis not present

## 2023-05-22 LAB — POCT INFLUENZA A/B
Influenza A, POC: NEGATIVE
Influenza B, POC: NEGATIVE

## 2023-05-22 MED ORDER — PROMETHAZINE-DM 6.25-15 MG/5ML PO SYRP: 5.0000 mL | ORAL_SOLUTION | Freq: Two times a day (BID) | ORAL | 0 refills | Status: DC | PRN

## 2023-05-22 MED ORDER — PREDNISONE 10 MG (21) PO TBPK: ORAL_TABLET | ORAL | 0 refills | Status: DC

## 2023-05-22 MED ORDER — ALBUTEROL SULFATE HFA 108 (90 BASE) MCG/ACT IN AERS
INHALATION_SPRAY | RESPIRATORY_TRACT | Status: AC
  Filled 2023-05-22: qty 6.7

## 2023-05-22 MED ORDER — ALBUTEROL SULFATE HFA 108 (90 BASE) MCG/ACT IN AERS
2.0000 | INHALATION_SPRAY | Freq: Once | RESPIRATORY_TRACT | Status: AC
  Administered 2023-05-22: 2 via RESPIRATORY_TRACT

## 2023-05-22 NOTE — Discharge Instructions (Signed)
I am concerned that you have a virus.  You tested negative for flu.  We will contact you if you are positive for COVID.  If you are positive for COVID I recommend Paxlovid given your history of asthma.  You would need to hold your cyclobenzaprine while on this medication and for 3 days after completing medicine.  Start prednisone taper.  Do not take NSAIDs with this medication due to risk of GI bleeding.  This includes aspirin, ibuprofen/Advil, naproxen/Aleve.  You can use Tylenol, Mucinex, Flonase.  Take Promethazine DM for cough.  This will make you sleepy so do not drive or drink alcohol taking it.  If your symptoms are not improving please return for reevaluation.  If anything worsens and you have worsening cough, shortness of breath, shortness of breath, chest pain, nausea, vomiting, weakness you need to be seen immediately.

## 2023-05-22 NOTE — ED Provider Notes (Signed)
[facility]    CSN: [id number] Arrival date & time: 05/22/23  1733      History   Chief Complaint Chief Complaint  Patient presents with   Generalized Body Aches   Cough    HPI Nichole Barber is a 47 y.o. female.   Patient presents today with a 36-hour history of URI symptoms.  Reports cough, congestion, body aches, subjective fever, chills, fatigue, malaise.  Reports that several of her children have been sick but does not know what they were ultimately diagnosed with.  She has not had any, chest pain, increased shortness of breath, nausea, vomiting, diarrhea.  She has tried DayQuil without improvement of symptoms.  She has a history of asthma but does not have her albuterol inhaler available.  She has not been hospitalized in adulthood for asthma.  Denies any recent antibiotics or steroids.  She has had COVID several years ago.  She is eating and drinking normally.  She is having difficulty with her daily duties as a result of symptoms.    Past Medical History:  Diagnosis Date   Anemia    Asthma    Bronchitis     Patient Active Problem List   Diagnosis Date Noted   Bilateral elbow joint pain 09/24/2020   Hypochromic microcytic anemia 10/08/2018   Pain in right knee 05/09/2018   Pain in both hands 07/05/2016   General medical exam 07/15/2015   Abnormal uterine bleeding 02/13/2013   Displacement of lumbar intervertebral disc 05/02/2011   Low back pain 05/02/2011    Past Surgical History:  Procedure Laterality Date   CARPAL TUNNEL RELEASE     CESAREAN SECTION     HERNIA REPAIR     KNEE SURGERY      OB History   No obstetric history on file.      Home Medications    Prior to Admission medications   Medication Sig Start Date End Date Taking? Authorizing Provider  predniSONE (STERAPRED UNI-PAK 21 TAB) 10 MG (21) TBPK tablet As directed 05/22/23  Yes Gemini Beaumier K, PA-C  promethazine-dextromethorphan (PROMETHAZINE-DM) 6.25-15 MG/5ML syrup Take 5  mLs by mouth 2 (two) times daily as needed for cough. 05/22/23  Yes Nuno Brubacher K, PA-C  albuterol (VENTOLIN HFA) 108 (90 Base) MCG/ACT inhaler Frequency:PHARMDIR   Dosage:90   [facility]  Instructions:  Note:used inhaler as needed daily for asthma attacks Dose: 90 MCG 08/08/12   [provider]  beclomethasone (QVAR) 40 MCG/ACT inhaler Frequency:PHARMDIR   Dosage:40   [facility]  Instructions:  Note:use q var inhaler twice daily; if dose is lower than home dose please provide 80 mcg inhaler Dose: 40 MCG 08/08/12   [provider]  Cetirizine HCl 10 MG CAPS 10 mg. 11/22/11   [provider]  cyclobenzaprine (FLEXERIL) 10 MG tablet Take 1 tablet (10 mg total) by mouth 2 (two) times daily as needed for muscle spasms. 05/29/21   White, Elita Boone, NP  ferrous sulfate (EQL SLOW RELEASE IRON) 160 (50 Fe) MG TBCR SR tablet Take 1 tablet (160 mg total) by mouth daily. 02/22/17   Emily Filbert, MD  ferrous sulfate 325 (65 FE) MG tablet Take 325 mg by mouth daily. 02/08/21   [provider]  Fluocinolone Acetonide Body 0.01 % OIL 0.01 %. 09/29/13   [provider]  fluticasone (FLONASE) 50 MCG/ACT nasal spray FLUTICASONE PROPIONATE 50 MCG/ACT SUSP 08/04/13   [provider]  mupirocin cream (BACTROBAN) 2 % 2 %. 06/18/15  [provider]  nitroGLYCERIN (NITRODUR - DOSED IN MG/24 HR) 0.2 mg/hr patch Use 1/4 patch daily to the right elbow. 09/23/20   Nestor Ramp, MD  omeprazole (PRILOSEC) 40 MG capsule 40 mg. 11/22/11   [provider]  SUMAtriptan (IMITREX) 25 MG tablet 25 mg. 09/17/13   [provider]    Family History History reviewed. No pertinent family history.  Social History Social History   Tobacco Use   Smoking status: Every Day    Types: Cigarettes   Smokeless tobacco: Never  Vaping Use   Vaping status: Never Used  Substance Use Topics   Alcohol use: No   Drug use: No     Allergies   Amoxicillin-pot clavulanate,  Feraheme [ferumoxytol], Metronidazole, and Pineapple   Review of Systems Review of Systems  Constitutional:  Positive for activity change, chills, fatigue and fever. Negative for appetite change.  HENT:  Positive for congestion, postnasal drip and rhinorrhea. Negative for sinus pressure, sneezing and sore throat.   Respiratory:  Positive for cough. Negative for shortness of breath.   Cardiovascular:  Negative for chest pain.  Gastrointestinal:  Negative for abdominal pain, diarrhea, nausea and vomiting.  Musculoskeletal:  Positive for arthralgias and myalgias.  Neurological:  Positive for headaches. Negative for dizziness and light-headedness.     Physical Exam Triage Vital Signs ED Triage Vitals  Encounter Vitals Group     BP 05/22/23 1826 (!) 144/94     Systolic BP Percentile --      Diastolic BP Percentile --      Pulse Rate 05/22/23 1826 (!) 104     Resp 05/22/23 1826 16     Temp 05/22/23 1826 98.8 F (37.1 C)     Temp Source 05/22/23 1826 Oral     SpO2 05/22/23 1826 93 %     Weight --      Height --      Head Circumference --      Peak Flow --      Pain Score 05/22/23 1824 7     Pain Loc --      Pain Education --      Exclude from Growth Chart --    No data found.  Updated Vital Signs BP (!) 144/94 (BP Location: Right Arm)   Pulse (!) 102   Temp 98.8 F (37.1 C) (Oral)   Resp 16   SpO2 95%   Visual Acuity Right Eye Distance:   Left Eye Distance:   Bilateral Distance:    Right Eye Near:   Left Eye Near:    Bilateral Near:     Physical Exam Vitals reviewed.  Constitutional:      General: She is awake. She is not in acute distress.    Appearance: Normal appearance. She is well-developed. She is not ill-appearing.     Comments: Very pleasant female appears stated age in no acute distress sitting comfortably in exam room  HENT:     Head: Normocephalic and atraumatic.     Right Ear: Tympanic membrane, ear canal and external ear normal. Tympanic membrane  is not erythematous or bulging.     Left Ear: Tympanic membrane, ear canal and external ear normal. Tympanic membrane is not erythematous or bulging.     Nose:     Right Sinus: No maxillary sinus tenderness or frontal sinus tenderness.     Left Sinus: No maxillary sinus tenderness or frontal sinus tenderness.     Mouth/Throat:     Pharynx:  Uvula midline. Posterior oropharyngeal erythema present. No oropharyngeal exudate.  Cardiovascular:     Rate and Rhythm: Normal rate and regular rhythm.     Heart sounds: Normal heart sounds, S1 normal and S2 normal. No murmur heard. Pulmonary:     Effort: Pulmonary effort is normal.     Breath sounds: Wheezing present. No rhonchi or rales.     Comments: Scattered wheezing Psychiatric:        Behavior: Behavior is cooperative.      UC Treatments / Results  Labs (all labs ordered are listed, but only abnormal results are displayed) Labs Reviewed  SARS CORONAVIRUS 2 (TAT 6-24 HRS)  POCT INFLUENZA A/B    EKG   Radiology No results found.  Procedures Procedures (including critical care time)  Medications Ordered in UC Medications  albuterol (VENTOLIN HFA) 108 (90 Base) MCG/ACT inhaler 2 puff (2 puffs Inhalation Given 05/22/23 1842)    Initial Impression / Assessment and Plan / UC Course  I have reviewed the triage vital signs and the nursing notes.  Pertinent labs & imaging results that were available during my care of the patient were reviewed by me and considered in my medical decision making (see chart for details).     Patient is well-appearing, afebrile, nontoxic.  She is mildly tachycardic but I suspect this is related to her acute illness.  She had wheezing on exam but this improved with albuterol in clinic.  She was sent home with albuterol with instruction to use this every 4-6 hours as needed.  Discussed likely viral etiology of symptoms that triggered an asthma exacerbation.  Flu testing was negative.  COVID testing is  pending.  She is a candidate for antiviral therapy if positive given her history of asthma.  Her last metabolic panel was obtained 10/20/2022 with normal creatinine and EGFR greater than 60 mL/min.  She was given prednisone taper and we discussed that she is not to take NSAIDs with this medication.  Can use over-the-counter medication including Tylenol, Mucinex, Flonase.  She was prescribed Promethazine DM for cough.  We discussed that this can be sedating and she is not to drive or drink alcohol taking it.  Recommended that she rest and drink plenty of fluid.  If her symptoms are improving within a week she is to return for reevaluation.  If anything worsens she needs to be seen immediately.  Strict return precautions given.  All questions answered to patient satisfaction.  Work excuse note provided.  Patient is candidate for antiviral therapy: Yes Medication recommendation: nirmatrelvir ritonavir eGFR: > 60 mL/min Renal Dosing: No Medication adjustment while on antiviral therapy: Hold cyclobenzaprine while on this medication for 3 days after completing course.   Final Clinical Impressions(s) / UC Diagnoses   Final diagnoses:  Upper respiratory tract infection, unspecified type  Mild intermittent asthma with acute exacerbation     Discharge Instructions      I am concerned that you have a virus.  You tested negative for flu.  We will contact you if you are positive for COVID.  If you are positive for COVID I recommend Paxlovid given your history of asthma.  You would need to hold your cyclobenzaprine while on this medication and for 3 days after completing medicine.  Start prednisone taper.  Do not take NSAIDs with this medication due to risk of GI bleeding.  This includes aspirin, ibuprofen/Advil, naproxen/Aleve.  You can use Tylenol, Mucinex, Flonase.  Take Promethazine DM for cough.  This will make  you sleepy so do not drive or drink alcohol taking it.  If your symptoms are not improving  please return for reevaluation.  If anything worsens and you have worsening cough, shortness of breath, shortness of breath, chest pain, nausea, vomiting, weakness you need to be seen immediately.     ED Prescriptions     Medication Sig Dispense Auth. Provider   predniSONE (STERAPRED UNI-PAK 21 TAB) 10 MG (21) TBPK tablet As directed 21 tablet Vana Arif K, PA-C   promethazine-dextromethorphan (PROMETHAZINE-DM) 6.25-15 MG/5ML syrup Take 5 mLs by mouth 2 (two) times daily as needed for cough. 118 mL Mariella Blackwelder K, PA-C      PDMP not reviewed this encounter.   Jeani Hawking, PA-C 05/22/23 [id number]

## 2023-05-22 NOTE — ED Triage Notes (Signed)
Pt c/o cough, nasal drainage, and body aches that started yesterday.   Pt took dayquil around 12pm today

## 2023-05-23 ENCOUNTER — Telehealth (HOSPITAL_COMMUNITY): Payer: Self-pay

## 2023-05-23 LAB — SARS CORONAVIRUS 2 (TAT 6-24 HRS): SARS Coronavirus 2: NEGATIVE

## 2023-05-23 NOTE — Telephone Encounter (Signed)
Informed by front desk Patient calling in about test results.

## 2023-05-23 NOTE — Telephone Encounter (Signed)
Patient informed that Covid testing was negative. Patient verbalized understanding.

## 2023-07-15 ENCOUNTER — Other Ambulatory Visit: Payer: Self-pay | Admitting: Primary Care

## 2023-07-15 DIAGNOSIS — Z1231 Encounter for screening mammogram for malignant neoplasm of breast: Secondary | ICD-10-CM

## 2023-12-02 ENCOUNTER — Encounter (HOSPITAL_COMMUNITY): Payer: Self-pay

## 2023-12-02 ENCOUNTER — Ambulatory Visit (HOSPITAL_COMMUNITY)
Admission: RE | Admit: 2023-12-02 | Discharge: 2023-12-02 | Disposition: A | Discharge status: Home / Self Care | Source: Ambulatory Visit

## 2023-12-02 VITALS — BP 144/87 | HR 89 | Temp 97.7°F | Resp 18

## 2023-12-02 DIAGNOSIS — L299 Pruritus, unspecified: Secondary | ICD-10-CM | POA: Diagnosis not present

## 2023-12-02 DIAGNOSIS — Z77120 Contact with and (suspected) exposure to mold (toxic): Secondary | ICD-10-CM | POA: Diagnosis not present

## 2023-12-02 DIAGNOSIS — R197 Diarrhea, unspecified: Secondary | ICD-10-CM

## 2023-12-02 MED ORDER — CETIRIZINE HCL 10 MG PO TABS: 10.0000 mg | ORAL_TABLET | Freq: Every day | ORAL | 0 refills | Status: AC

## 2023-12-02 MED ORDER — LOPERAMIDE HCL 2 MG PO CAPS: 2.0000 mg | ORAL_CAPSULE | Freq: Four times a day (QID) | ORAL | 0 refills | Status: AC | PRN
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
## 2023-12-02 NOTE — Discharge Instructions (Signed)
 Take cetirizine once daily to help with itching.  You can take 25 mg of over-the-counter Benadryl  every 6 hours as needed for itching.  This can make you drowsy so do not drive, work, or drink alcohol while taking this. Take Imodium  4 times daily as needed for diarrhea.  Make sure you are staying hydrated. You can follow-up with the allergy specialist that have attached your paperwork or your primary care doctor regarding mold exposure. Return here as needed

## 2024-01-16 ENCOUNTER — Ambulatory Visit
Admission: RE | Admit: 2024-01-16 | Discharge: 2024-01-16 | Disposition: A | Discharge status: Home / Self Care | Source: Ambulatory Visit | Attending: Primary Care | Admitting: Primary Care

## 2024-01-16 DIAGNOSIS — Z1231 Encounter for screening mammogram for malignant neoplasm of breast: Secondary | ICD-10-CM | POA: Diagnosis present

## 2024-02-06 ENCOUNTER — Telehealth: Payer: Self-pay

## 2024-02-06 ENCOUNTER — Encounter: Payer: Self-pay | Admitting: Hematology and Oncology

## 2024-02-06 ENCOUNTER — Other Ambulatory Visit: Payer: Self-pay

## 2024-02-06 DIAGNOSIS — Z1211 Encounter for screening for malignant neoplasm of colon: Secondary | ICD-10-CM

## 2024-02-06 MED ORDER — NA SULFATE-K SULFATE-MG SULF 17.5-3.13-1.6 GM/177ML PO SOLN: 1.0000 | Freq: Once | ORAL | 0 refills | Status: AC

## 2024-02-06 NOTE — Telephone Encounter (Signed)
 Gastroenterology Pre-Procedure Review  Request Date: 05/14/24 Requesting Physician: Dr. Jinny  PATIENT REVIEW QUESTIONS: The patient responded to the following health history questions as indicated:    1. Are you having any GI issues? no 2. Do you have a personal history of Polyps? no 3. Do you have a family history of Colon Cancer or Polyps? no 4. Diabetes Mellitus? no 5. Joint replacements in the past 12 months?no 6. Major health problems in the past 3 months?no 7. Any artificial heart valves, MVP, or defibrillator?no    MEDICATIONS & ALLERGIES:    Patient reports the following regarding taking any anticoagulation/antiplatelet therapy:   Plavix, Coumadin, Eliquis, Xarelto, Lovenox, Pradaxa, Brilinta, or Effient? no Aspirin? no  Patient confirms/reports the following medications:  Current Outpatient Medications  Medication Sig Dispense Refill   ADVAIR DISKUS 100-50 MCG/ACT AEPB Inhale 1 puff into the lungs 2 (two) times daily.     albuterol  (VENTOLIN  HFA) 108 (90 Base) MCG/ACT inhaler Frequency:PHARMDIR   Dosage:90   MCG  Instructions:  Note:used inhaler as needed daily for asthma attacks Dose: 90 MCG     beclomethasone (QVAR) 40 MCG/ACT inhaler Frequency:PHARMDIR   Dosage:40   MCG  Instructions:  Note:use q var inhaler twice daily; if dose is lower than home dose please provide 80 mcg inhaler Dose: 40 MCG     cetirizine  (ZYRTEC  ALLERGY) 10 MG tablet Take 1 tablet (10 mg total) by mouth daily. 30 tablet 0   cyclobenzaprine  (FLEXERIL ) 10 MG tablet Take 1 tablet (10 mg total) by mouth 2 (two) times daily as needed for muscle spasms. 20 tablet 0   fluticasone (FLONASE) 50 MCG/ACT nasal spray FLUTICASONE PROPIONATE 50 MCG/ACT SUSP     lisinopril-hydrochlorothiazide (ZESTORETIC) 20-12.5 MG tablet Take 1 tablet by mouth daily.     omeprazole (PRILOSEC) 40 MG capsule 40 mg.     SUMAtriptan (IMITREX) 25 MG tablet 25 mg.     ferrous sulfate  (EQL SLOW RELEASE IRON ) 160 (50 Fe) MG TBCR SR tablet  Take 1 tablet (160 mg total) by mouth daily. (Patient not taking: Reported on 02/06/2024) 30 each 6   ferrous sulfate  325 (65 FE) MG tablet Take 325 mg by mouth daily. (Patient not taking: Reported on 02/06/2024)     Fluocinolone Acetonide Body 0.01 % OIL 0.01 %. (Patient not taking: Reported on 02/06/2024)     loperamide  (IMODIUM ) 2 MG capsule Take 1 capsule (2 mg total) by mouth 4 (four) times daily as needed for diarrhea or loose stools. (Patient not taking: Reported on 02/06/2024) 12 capsule 0   mupirocin cream (BACTROBAN) 2 % 2 %. (Patient not taking: Reported on 02/06/2024)     nitroGLYCERIN  (NITRODUR - DOSED IN MG/24 HR) 0.2 mg/hr patch Use 1/4 patch daily to the right elbow. 30 patch 1   predniSONE  (STERAPRED UNI-PAK 21 TAB) 10 MG (21) TBPK tablet As directed (Patient not taking: Reported on 02/06/2024) 21 tablet 0   promethazine -dextromethorphan (PROMETHAZINE -DM) 6.25-15 MG/5ML syrup Take 5 mLs by mouth 2 (two) times daily as needed for cough. (Patient not taking: Reported on 02/06/2024) 118 mL 0   No current facility-administered medications for this visit.    Patient confirms/reports the following allergies:  Allergies  Allergen Reactions   Amoxicillin-Pot Clavulanate Hives   Feraheme  [Ferumoxytol ] Shortness Of Breath    Tolerated feraheme  w/ famotidine  and diphenhydramine  premedication.   Metronidazole Hives   Pineapple Hives    No orders of the defined types were placed in this encounter.   AUTHORIZATION INFORMATION Primary  Insurance: 1D#: Group #:  Secondary Insurance: 1D#: Group #:  SCHEDULE INFORMATION: Date: 05/14/24 Time: Location: [facility]

## 2024-03-08 ENCOUNTER — Ambulatory Visit (HOSPITAL_COMMUNITY)
Admission: EM | Admit: 2024-03-08 | Discharge: 2024-03-08 | Disposition: A | Discharge status: Home / Self Care | Attending: Physician Assistant | Admitting: Physician Assistant

## 2024-03-08 ENCOUNTER — Encounter (HOSPITAL_COMMUNITY): Payer: Self-pay

## 2024-03-08 DIAGNOSIS — S161XXA Strain of muscle, fascia and tendon at neck level, initial encounter: Secondary | ICD-10-CM | POA: Diagnosis not present

## 2024-03-08 DIAGNOSIS — M542 Cervicalgia: Secondary | ICD-10-CM

## 2024-03-08 MED ORDER — CELECOXIB 100 MG PO CAPS: 100.0000 mg | ORAL_CAPSULE | Freq: Two times a day (BID) | ORAL | 0 refills | Status: AC

## 2024-03-08 MED ORDER — METHOCARBAMOL 500 MG PO TABS: 500.0000 mg | ORAL_TABLET | Freq: Two times a day (BID) | ORAL | 0 refills | Status: AC

## 2024-03-08 NOTE — ED Provider Notes (Signed)
 [facility]    CSN: [id number] Arrival date & time: 03/08/24  1203      History   Chief Complaint Chief Complaint  Patient presents with   Neck Pain    Left sided neck pain    HPI Nichole Barber is a 48 y.o. female.   Patient presents today with a 1 week history of left-sided neck pain.  She denies any known injury or increase in activity prior to symptom onset; reports that symptoms began when she woke up 1 morning.  They have been stable and persistent since onset.  Pain is rated 4 on his rated pain scale, described as aching, no alleviating factors identified, worse with swallowing or chewing as well as rotation of the neck.  Denies any previous injury or surgery involving her neck.  Denies any additional symptoms including sore throat, fever, headache, nausea, vomiting, rash.  She has tried Benadryl  but no other over-the-counter medications for symptom management.  Denies any known sick contacts.  Denies any significant past medical history including history of malignancy.    Past Medical History:  Diagnosis Date   Anemia    Asthma    Bronchitis     Patient Active Problem List   Diagnosis Date Noted   Bilateral elbow joint pain 09/24/2020   Hypochromic microcytic anemia 10/08/2018   Pain in right knee 05/09/2018   Pain in both hands 07/05/2016   General medical exam 07/15/2015   Abnormal uterine bleeding 02/13/2013   Displacement of lumbar intervertebral disc 05/02/2011   Low back pain 05/02/2011    Past Surgical History:  Procedure Laterality Date   CARPAL TUNNEL RELEASE     CESAREAN SECTION     HERNIA REPAIR     KNEE SURGERY      OB History   No obstetric history on file.      Home Medications    Prior to Admission medications   Medication Sig Start Date End Date Taking? Authorizing Provider  ADVAIR DISKUS 100-50 MCG/ACT AEPB Inhale 1 puff into the lungs 2 (two) times daily. 09/20/23  Yes [provider]  albuterol  (VENTOLIN   HFA) 108 (90 Base) MCG/ACT inhaler Frequency:PHARMDIR   Dosage:90   MCG  Instructions:  Note:used inhaler as needed daily for asthma attacks Dose: 90 MCG 08/08/12  Yes [provider]  beclomethasone (QVAR) 40 MCG/ACT inhaler Frequency:PHARMDIR   Dosage:40   MCG  Instructions:  Note:use q var inhaler twice daily; if dose is lower than home dose please provide 80 mcg inhaler Dose: 40 MCG 08/08/12  Yes [provider]  celecoxib  (CELEBREX ) 100 MG capsule Take 1 capsule (100 mg total) by mouth 2 (two) times daily. 03/08/24  Yes Dewaun Kinzler, Rocky POUR, PA-C  cetirizine  (ZYRTEC  ALLERGY) 10 MG tablet Take 1 tablet (10 mg total) by mouth daily. 12/02/23  Yes Johnie, Carley A, NP  ferrous sulfate  (EQL SLOW RELEASE IRON ) 160 (50 Fe) MG TBCR SR tablet Take 1 tablet (160 mg total) by mouth daily. 02/22/17  Yes Trudy Dorn BRAVO, MD  ferrous sulfate  325 (65 FE) MG tablet Take 325 mg by mouth daily. 02/08/21  Yes [provider]  Fluocinolone Acetonide Body 0.01 % OIL 0.01 %. 09/29/13  Yes [provider]  fluticasone (FLONASE) 50 MCG/ACT nasal spray FLUTICASONE PROPIONATE 50 MCG/ACT SUSP 08/04/13  Yes [provider]  lisinopril-hydrochlorothiazide (ZESTORETIC) 20-12.5 MG tablet Take 1 tablet by mouth daily. 01/28/24  Yes [provider]  loperamide  (IMODIUM ) 2 MG capsule Take 1 capsule (  2 mg total) by mouth 4 (four) times daily as needed for diarrhea or loose stools. 12/02/23  Yes Johnie, Carley A, NP  methocarbamol  (ROBAXIN ) 500 MG tablet Take 1 tablet (500 mg total) by mouth 2 (two) times daily. 03/08/24  Yes Melvyn Hommes K, PA-C  mupirocin cream (BACTROBAN) 2 % 2 %. 06/18/15  Yes [provider]  nitroGLYCERIN  (NITRODUR - DOSED IN MG/24 HR) 0.2 mg/hr patch Use 1/4 patch daily to the right elbow. 09/23/20  Yes Rosalynn Camie CROME, MD  omeprazole (PRILOSEC) 40 MG capsule 40 mg. 11/22/11  Yes [provider]  SUMAtriptan (IMITREX) 25 MG tablet 25 mg. 09/17/13  Yes  [provider]    Family History Family History  Problem Relation Age of Onset   Breast cancer Other        m 2nd cousin    Social History Social History   Tobacco Use   Smoking status: Every Day    Types: Cigars   Smokeless tobacco: Never  Vaping Use   Vaping status: Never Used  Substance Use Topics   Alcohol use: No   Drug use: No     Allergies   Amoxicillin-pot clavulanate, Feraheme  [ferumoxytol ], Metronidazole, and Pineapple   Review of Systems Review of Systems  Constitutional:  Negative for activity change, appetite change, fatigue and fever.  HENT:  Negative for sore throat, trouble swallowing and voice change.   Respiratory:  Negative for cough.   Gastrointestinal:  Negative for nausea and vomiting.  Musculoskeletal:  Positive for neck pain. Negative for arthralgias, back pain and myalgias.  Neurological:  Negative for dizziness, light-headedness and headaches.     Physical Exam Triage Vital Signs ED Triage Vitals  Encounter Vitals Group     BP 03/08/24 1307 (!) 142/88     Girls Systolic BP Percentile --      Girls Diastolic BP Percentile --      Boys Systolic BP Percentile --      Boys Diastolic BP Percentile --      Pulse Rate 03/08/24 1307 94     Resp 03/08/24 1307 16     Temp 03/08/24 1307 98 F (36.7 C)     Temp Source 03/08/24 1307 Oral     SpO2 03/08/24 1307 93 %     Weight 03/08/24 1306 170 lb (77.1 kg)     Height 03/08/24 1306 5' 5 (1.651 m)     Head Circumference --      Peak Flow --      Pain Score 03/08/24 1305 0     Pain Loc --      Pain Education --      Exclude from Growth Chart --    No data found.  Updated Vital Signs BP (!) 142/88 (BP Location: Left Arm)   Pulse 94   Temp 98 F (36.7 C) (Oral)   Resp 16   Ht 5' 5 (1.651 m)   Wt 170 lb (77.1 kg)   SpO2 93%   BMI 28.29 kg/m   Visual Acuity Right Eye Distance:   Left Eye Distance:   Bilateral Distance:    Right Eye Near:   Left Eye Near:     Bilateral Near:     Physical Exam Vitals reviewed.  Constitutional:      General: She is awake. She is not in acute distress.    Appearance: Normal appearance. She is well-developed. She is not ill-appearing.     Comments: Very pleasant female appears stated  age in no acute distress sitting comfortably in exam room  HENT:     Head: Normocephalic and atraumatic.     Mouth/Throat:     Dentition: Has dentures.     Pharynx: Uvula midline. No oropharyngeal exudate or posterior oropharyngeal erythema.     Comments: Normal-appearing posterior oropharynx Neck:     Comments: Tenderness palpation with reproducible pain along left sternocleidomastoid.  Increased pain with rotation of neck. Cardiovascular:     Rate and Rhythm: Normal rate and regular rhythm.     Heart sounds: Normal heart sounds, S1 normal and S2 normal. No murmur heard. Pulmonary:     Effort: Pulmonary effort is normal.     Breath sounds: Normal breath sounds. No wheezing, rhonchi or rales.     Comments: Clear to auscultation bilaterally Musculoskeletal:     Cervical back: Normal range of motion and neck supple. Pain with movement and muscular tenderness present. No spinous process tenderness.  Lymphadenopathy:     Cervical: No cervical adenopathy.  Psychiatric:        Behavior: Behavior is cooperative.      UC Treatments / Results  Labs (all labs ordered are listed, but only abnormal results are displayed) Labs Reviewed - No data to display  EKG   Radiology No results found.  Procedures Procedures (including critical care time)  Medications Ordered in UC Medications - No data to display  Initial Impression / Assessment and Plan / UC Course  I have reviewed the triage vital signs and the nursing notes.  Pertinent labs & imaging results that were available during my care of the patient were reviewed by me and considered in my medical decision making (see chart for details).     Patient is  well-appearing, afebrile, nontoxic, nontachycardic.  No indication for plain films that she denies any recent trauma and has no focal bony tenderness.  Given her pain tracks along the SCM on the left and is reproducible on palpation of this muscle belly I am concerned for a strain of SCM as etiology of symptoms.  Low suspicion for peritonsillar abscess, sialadenitis, lymphadenopathy given clinical findings and normal vitals.  Will treat for musculoskeletal etiology with Celebrex  100 mg twice daily.  She was instructed not to take additional NSAIDs with this medication and risk of GI bleeding.  No indication for dose adjustment based on metabolic panel from 10/20/2022 with creatinine of 0.68 and calculated creatinine clearance of 120 mL/min.  She was also given short course of Robaxin  up to twice a day.  We discussed that this medication is sedating and she should limit use during the day if she is going to be active.  We discussed that if her symptoms are not improving or if anything worsen she needs to be seen immediately.  Return precautions discussed.  Excuse note provided.  Final Clinical Impressions(s) / UC Diagnoses   Final diagnoses:  Strain of sternocleidomastoid muscle, initial encounter  Neck pain     Discharge Instructions      As discussed, I am concerned that you have injured one of the muscles in your neck.  Take Robaxin  (methocarbamol ) up to twice a day.  This will make you sleepy so do not drive or drink alcohol with taking it and it is okay to just take it at night if you are going to be active during the day.  Start Celebrex  twice daily for a week.  Do not take additional NSAIDs including aspirin, ibuprofen /Advil , naproxen /Aleve .  If your symptoms  are not improving within a week or if anything worsens and you have difficulty moving your neck, high fever, rash, headache, nausea, vomiting, difficulty swallowing, sore throat you should be reevaluated.     ED Prescriptions      Medication Sig Dispense Auth. Provider   methocarbamol  (ROBAXIN ) 500 MG tablet Take 1 tablet (500 mg total) by mouth 2 (two) times daily. 10 tablet Darral Rishel K, PA-C   celecoxib  (CELEBREX ) 100 MG capsule Take 1 capsule (100 mg total) by mouth 2 (two) times daily. 14 capsule Nathalie Cavendish K, PA-C      PDMP not reviewed this encounter.   Sherrell Rocky POUR, PA-C 03/08/24 1337

## 2024-03-08 NOTE — Discharge Instructions (Signed)
 As discussed, I am concerned that you have injured one of the muscles in your neck.  Take Robaxin  (methocarbamol ) up to twice a day.  This will make you sleepy so do not drive or drink alcohol with taking it and it is okay to just take it at night if you are going to be active during the day.  Start Celebrex  twice daily for a week.  Do not take additional NSAIDs including aspirin, ibuprofen /Advil , naproxen /Aleve .  If your symptoms are not improving within a week or if anything worsens and you have difficulty moving your neck, high fever, rash, headache, nausea, vomiting, difficulty swallowing, sore throat you should be reevaluated.

## 2024-03-08 NOTE — ED Triage Notes (Signed)
 Pt states that she has some left sided neck pain. X1 week

## 2024-05-14 ENCOUNTER — Encounter: Payer: Self-pay | Admitting: Gastroenterology

## 2024-05-14 ENCOUNTER — Ambulatory Visit: Admitting: Anesthesiology

## 2024-05-14 ENCOUNTER — Ambulatory Visit
Admission: RE | Admit: 2024-05-14 | Discharge: 2024-05-14 | Disposition: A | Discharge status: Home / Self Care | Attending: Gastroenterology | Admitting: Gastroenterology

## 2024-05-14 ENCOUNTER — Encounter
Admission: RE | Disposition: A | Discharge status: Home / Self Care | Payer: Self-pay | Source: Home / Self Care | Attending: Gastroenterology

## 2024-05-14 DIAGNOSIS — J45909 Unspecified asthma, uncomplicated: Secondary | ICD-10-CM | POA: Insufficient documentation

## 2024-05-14 DIAGNOSIS — Z1211 Encounter for screening for malignant neoplasm of colon: Secondary | ICD-10-CM | POA: Insufficient documentation

## 2024-05-14 DIAGNOSIS — F17290 Nicotine dependence, other tobacco product, uncomplicated: Secondary | ICD-10-CM | POA: Diagnosis not present

## 2024-05-14 HISTORY — PX: COLONOSCOPY: SHX5424

## 2024-05-14 SURGERY — COLONOSCOPY
Anesthesia: General

## 2024-05-14 MED ORDER — PROPOFOL 500 MG/50ML IV EMUL
INTRAVENOUS | Status: DC | PRN
  Administered 2024-05-14: 125 ug/kg/min via INTRAVENOUS

## 2024-05-14 MED ORDER — PROPOFOL 10 MG/ML IV BOLUS
INTRAVENOUS | Status: DC | PRN
  Administered 2024-05-14: 60 mg via INTRAVENOUS

## 2024-05-14 MED ORDER — SODIUM CHLORIDE 0.9 % IV SOLN: INTRAVENOUS | Status: DC

## 2024-05-14 MED ORDER — LIDOCAINE HCL (CARDIAC) PF 100 MG/5ML IV SOSY
PREFILLED_SYRINGE | INTRAVENOUS | Status: DC | PRN
  Administered 2024-05-14: 40 mg via INTRAVENOUS

## 2024-05-14 MED ORDER — LIDOCAINE HCL (PF) 2 % IJ SOLN
INTRAMUSCULAR | Status: AC
  Filled 2024-05-14: qty 5

## 2024-05-14 NOTE — Anesthesia Postprocedure Evaluation (Signed)
 Anesthesia Post Note  Patient: Nichole Barber  Procedure(s) Performed: COLONOSCOPY  Patient location during evaluation: PACU Anesthesia Type: General Level of consciousness: awake Pain management: satisfactory to patient Vital Signs Assessment: post-procedure vital signs reviewed and stable Respiratory status: spontaneous breathing Cardiovascular status: stable Anesthetic complications: no   No notable events documented.   Last Vitals:  Vitals:   05/14/24 1016 05/14/24 1025  BP: (!) 151/85 (!) 156/97  Pulse: 68 61  Resp: 14 18  Temp:    SpO2: 100% 100%    Last Pain:  Vitals:   05/14/24 1025  TempSrc:   PainSc: 0-No pain                 VAN STAVEREN,Anelisse Jacobson

## 2024-05-14 NOTE — Op Note (Signed)
 [facility] Gastroenterology Patient Name: Nichole Barber Procedure Date: 05/14/2024 9:31 AM MRN: [id number] Account #: [id number] Date of Birth: 02/01/1976 Admit Type: Outpatient Age: 48 Room: [facility] ENDO ROOM 4 Gender: Female Note Status: Finalized Instrument Name: Colon Scope [id number] Procedure:             Colonoscopy Indications:           Screening for colorectal malignant neoplasm Providers:             Rogelia Copping MD, MD Referring MD:          Harlene RONAL Patten (Referring MD) Medicines:             Propofol per Anesthesia Complications:         No immediate complications. Procedure:             Pre-Anesthesia Assessment:                        - Prior to the procedure, a History and Physical was                         performed, and patient medications and allergies were                         reviewed. The patient's tolerance of previous                         anesthesia was also reviewed. The risks and benefits                         of the procedure and the sedation options and risks                         were discussed with the patient. All questions were                         answered, and informed consent was obtained. Prior                         Anticoagulants: The patient has taken no anticoagulant                         or antiplatelet agents. ASA Grade Assessment: II - A                         patient with mild systemic disease. After reviewing                         the risks and benefits, the patient was deemed in                         satisfactory condition to undergo the procedure.                        After obtaining informed consent, the colonoscope was                         passed under direct vision. Throughout the procedure,  the patient's blood pressure, pulse, and oxygen                         saturations were monitored continuously. The                         Colonoscope was introduced through  the anus and                         advanced to the the cecum, identified by appendiceal                         orifice and ileocecal valve. The colonoscopy was                         performed without difficulty. The patient tolerated                         the procedure well. The quality of the bowel                         preparation was excellent. Findings:      The perianal and digital rectal examinations were normal.      The colon (entire examined portion) appeared normal. Impression:            - The entire examined colon is normal.                        - No specimens collected. Recommendation:        - Discharge patient to home.                        - Resume previous diet.                        - Continue present medications.                        - Repeat colonoscopy in 10 years for screening                         purposes. Procedure Code(s):     --- Professional ---                        [phone], Colonoscopy, flexible; diagnostic, including                         collection of specimen(s) by brushing or washing, when                         performed (separate procedure) Diagnosis Code(s):     --- Professional ---                        Z12.11, Encounter for screening for malignant neoplasm                         of colon CPT copyright 2022 American Medical Association. All rights reserved. The codes documented in this report are preliminary and upon coder review may  be  revised to meet current compliance requirements. Rogelia Copping MD, MD 05/14/2024 10:01:19 AM This report has been signed electronically. Number of Addenda: 0 Note Initiated On: 05/14/2024 9:31 AM Scope Withdrawal Time: 0 hours 7 minutes 46 seconds  Total Procedure Duration: 0 hours 13 minutes 2 seconds  Estimated Blood Loss:  Estimated blood loss: none.      [facility]

## 2024-05-14 NOTE — H&P (Signed)
 Nichole Copping, MD [facility] [address] [place], [place] [address] Phone: [phone] Fax : [phone]  Primary Care Physician:  Era Raisin, NP Primary Gastroenterologist:  Dr. Copping  Pre-Procedure History & Physical: HPI:  Nichole Barber is a 48 y.o. female is here for a screening colonoscopy.   Past Medical History:  Diagnosis Date   Anemia    Asthma    Bronchitis     Past Surgical History:  Procedure Laterality Date   CARPAL TUNNEL RELEASE     CESAREAN SECTION     HERNIA REPAIR     KNEE SURGERY      Prior to Admission medications   Medication Sig Start Date End Date Taking? Authorizing Provider  ADVAIR DISKUS 100-50 MCG/ACT AEPB Inhale 1 puff into the lungs 2 (two) times daily. 09/20/23  Yes [provider]  albuterol  (VENTOLIN  HFA) 108 (90 Base) MCG/ACT inhaler Frequency:PHARMDIR   Dosage:90   MCG  Instructions:  Note:used inhaler as needed daily for asthma attacks Dose: 90 MCG 08/08/12  Yes [provider]  celecoxib  (CELEBREX ) 100 MG capsule Take 1 capsule (100 mg total) by mouth 2 (two) times daily. 03/08/24  Yes Raspet, Erin K, PA-C  cetirizine  (ZYRTEC  ALLERGY) 10 MG tablet Take 1 tablet (10 mg total) by mouth daily. 12/02/23  Yes Johnie, Carley A, NP  fluticasone (FLONASE) 50 MCG/ACT nasal spray FLUTICASONE PROPIONATE 50 MCG/ACT SUSP 08/04/13  Yes [provider]  lisinopril-hydrochlorothiazide (ZESTORETIC) 20-12.5 MG tablet Take 1 tablet by mouth daily. 01/28/24  Yes [provider]  omeprazole (PRILOSEC) 40 MG capsule 40 mg. 11/22/11  Yes [provider]  SUMAtriptan (IMITREX) 25 MG tablet 25 mg. 09/17/13  Yes [provider]  beclomethasone (QVAR) 40 MCG/ACT inhaler Frequency:PHARMDIR   Dosage:40   MCG  Instructions:  Note:use q var inhaler twice daily; if dose is lower than home dose please provide 80 mcg inhaler Dose: 40 MCG 08/08/12   [provider]  ferrous sulfate  (EQL SLOW RELEASE IRON ) 160  (50 Fe) MG TBCR SR tablet Take 1 tablet (160 mg total) by mouth daily. 02/22/17   Trudy Dorn BRAVO, MD  ferrous sulfate  325 (65 FE) MG tablet Take 325 mg by mouth daily. 02/08/21   [provider]  Fluocinolone Acetonide Body 0.01 % OIL 0.01 %. 09/29/13   [provider]  loperamide  (IMODIUM ) 2 MG capsule Take 1 capsule (2 mg total) by mouth 4 (four) times daily as needed for diarrhea or loose stools. 12/02/23   Johnie Flaming A, NP  methocarbamol  (ROBAXIN ) 500 MG tablet Take 1 tablet (500 mg total) by mouth 2 (two) times daily. 03/08/24   Raspet, Erin K, PA-C  mupirocin cream (BACTROBAN) 2 % 2 %. 06/18/15   [provider]  nitroGLYCERIN  (NITRODUR - DOSED IN MG/24 HR) 0.2 mg/hr patch Use 1/4 patch daily to the right elbow. 09/23/20   Rosalynn Camie CROME, MD    Allergies as of 02/06/2024 - Review Complete 02/06/2024  Allergen Reaction Noted   Amoxicillin-pot clavulanate Hives 02/13/2013   Feraheme  [ferumoxytol ] Shortness Of Breath 09/24/2019   Metronidazole Hives 08/02/2010   Pineapple Hives 08/02/2010    Family History  Problem Relation Age of Onset   Breast cancer Other        m 2nd cousin    Social History   Socioeconomic History   Marital status: Single    Spouse name: Not on file   Number of children: Not on file   Years of education:  Not on file   Highest education level: Not on file  Occupational History   Not on file  Tobacco Use   Smoking status: Every Day    Types: Cigars   Smokeless tobacco: Never  Vaping Use   Vaping status: Never Used  Substance and Sexual Activity   Alcohol use: No   Drug use: No   Sexual activity: Not on file  Other Topics Concern   Not on file  Social History Narrative   ** Merged History Encounter **       Social Drivers of Corporate investment banker Strain: Not on file  Food Insecurity: Not on file  Transportation Needs: Not on file  Physical Activity: Not on file  Stress: Not on file  Social Connections:  Not on file  Intimate Partner Violence: Not on file    Review of Systems: See HPI, otherwise negative ROS  Physical Exam: BP (!) 158/98   Pulse 70   Temp (!) 96.2 F (35.7 C) (Temporal)   Resp 16   Ht 5' 6 (1.676 m)   Wt 78.5 kg   SpO2 98%   BMI 27.92 kg/m  General:   Alert,  pleasant and cooperative in NAD Head:  Normocephalic and atraumatic. Neck:  Supple; no masses or thyromegaly. Lungs:  Clear throughout to auscultation.    Heart:  Regular rate and rhythm. Abdomen:  Soft, nontender and nondistended. Normal bowel sounds, without guarding, and without rebound.   Neurologic:  Alert and  oriented x4;  grossly normal neurologically.  Impression/Plan: Nichole Barber is now here to undergo a screening colonoscopy.  Risks, benefits, and alternatives regarding colonoscopy have been reviewed with the patient.  Questions have been answered.  All parties agreeable.

## 2024-05-14 NOTE — Transfer of Care (Signed)
 Immediate Anesthesia Transfer of Care Note  Patient: Nichole Barber  Procedure(s) Performed: COLONOSCOPY  Patient Location: PACU  Anesthesia Type:General  Level of Consciousness: drowsy and patient cooperative  Airway & Oxygen Therapy: Patient Spontanous Breathing  Post-op Assessment: Report given to RN and Post -op Vital signs reviewed and stable  Post vital signs: stable  Last Vitals:  Vitals Value Taken Time  BP    Temp    Pulse    Resp    SpO2      Last Pain:  Vitals:   05/14/24 0921  TempSrc: Temporal  PainSc: 0-No pain         Complications: No notable events documented.

## 2024-05-14 NOTE — Anesthesia Preprocedure Evaluation (Signed)
 Anesthesia Evaluation  Patient identified by MRN, date of birth, ID band Patient awake    Reviewed: Allergy & Precautions, NPO status , Patient's Chart, lab work & pertinent test results  Airway Mallampati: II  TM Distance: >3 FB Neck ROM: full    Dental  (+) Edentulous Upper, Edentulous Lower   Pulmonary neg pulmonary ROS, asthma , Current Smoker and Patient abstained from smoking.   Pulmonary exam normal  + decreased breath sounds      Cardiovascular Exercise Tolerance: Good negative cardio ROS Normal cardiovascular exam Rhythm:Regular Rate:Normal     Neuro/Psych negative neurological ROS  negative psych ROS   GI/Hepatic negative GI ROS, Neg liver ROS,,,  Endo/Other  negative endocrine ROS  Class 3 obesity  Renal/GU negative Renal ROS  negative genitourinary   Musculoskeletal   Abdominal  (+) + obese  Peds negative pediatric ROS (+)  Hematology negative hematology ROS (+) Blood dyscrasia, anemia   Anesthesia Other Findings Past Medical History: No date: Anemia No date: Asthma No date: Bronchitis  Past Surgical History: No date: CARPAL TUNNEL RELEASE No date: CESAREAN SECTION No date: HERNIA REPAIR No date: KNEE SURGERY     Reproductive/Obstetrics negative OB ROS                              Anesthesia Physical Anesthesia Plan  ASA: 3  Anesthesia Plan: General   Post-op Pain Management:    Induction: Intravenous  PONV Risk Score and Plan: Propofol infusion and TIVA  Airway Management Planned: Natural Airway and Nasal Cannula  Additional Equipment:   Intra-op Plan:   Post-operative Plan:   Informed Consent: I have reviewed the patients History and Physical, chart, labs and discussed the procedure including the risks, benefits and alternatives for the proposed anesthesia with the patient or authorized representative who has indicated his/her understanding and  acceptance.     Dental Advisory Given  Plan Discussed with: CRNA  Anesthesia Plan Comments:         Anesthesia Quick Evaluation

## 2024-08-16 ENCOUNTER — Ambulatory Visit (INDEPENDENT_AMBULATORY_CARE_PROVIDER_SITE_OTHER)

## 2024-08-16 ENCOUNTER — Ambulatory Visit (HOSPITAL_COMMUNITY): Admission: EM | Admit: 2024-08-16 | Discharge: 2024-08-16 | Disposition: A | Discharge status: Home / Self Care

## 2024-08-16 ENCOUNTER — Encounter (HOSPITAL_COMMUNITY): Payer: Self-pay

## 2024-08-16 ENCOUNTER — Ambulatory Visit (HOSPITAL_COMMUNITY)

## 2024-08-16 DIAGNOSIS — M79672 Pain in left foot: Secondary | ICD-10-CM | POA: Diagnosis not present

## 2024-08-16 DIAGNOSIS — S93602A Unspecified sprain of left foot, initial encounter: Secondary | ICD-10-CM

## 2024-08-16 DIAGNOSIS — S60221A Contusion of right hand, initial encounter: Secondary | ICD-10-CM | POA: Diagnosis not present

## 2024-08-16 MED ORDER — DICLOFENAC SODIUM 50 MG PO TBEC: 50.0000 mg | DELAYED_RELEASE_TABLET | Freq: Two times a day (BID) | ORAL | 1 refills | Status: AC

## 2024-08-16 NOTE — Discharge Instructions (Addendum)
" °  1. Sprain of left foot, initial encounter (Primary) - DG Foot Complete Left x-ray completed in UC shows no acute fracture or dislocation to the left foot - Apply ace wrap to left foot for compression and protection until injury improves. - Apply CAM boot to left foot for protection and immobilization from further injury.  2. Contusion of right hand, initial encounter - DG Hand Complete Right completed today in UC is negative for fracture or dislocation of the right hand or fingers. - diclofenac  (VOLTAREN ) 50 MG EC tablet; Take 1 tablet (50 mg total) by mouth 2 (two) times daily.  Dispense: 30 tablet; Refill: 1  -Continue to monitor symptoms for any change in severity if there is any escalation of current symptoms or development of new symptoms follow-up in ER for further evaluation and management. "

## 2024-08-16 NOTE — ED Triage Notes (Signed)
 Pt c/o (R) inner foot pain that started last night. Denies any injury or fall. Patient last took Advil  around 6 am. Pt has been elevating foot with minimal relief. Denies any swelling. Unable to bear weight. Patient has a stick for assistance with walking.   Pt reports (R) ring and middle finger aching x 1 week. Pt describes as a burning pain. Denies any numbness or tingling. Fingers have been swollen for about 1 week. Pt reports that she fell on the (R) side last Sunday and caught herself with her hand. Pt also reports (R) shoulder pain. No limited ROM.

## 2024-08-16 NOTE — ED Provider Notes (Signed)
 " [facility] [place]  Note:  This document was prepared using Dragon voice recognition software and may include unintentional dictation errors.  MRN: [id number] DOB: 11/15/1975  Subjective:   Nichole Barber is a 49 y.o. female presenting for new onset left foot pain that started last night.  Patient denies any known injury or trauma.  He has been taking Advil  with minimal improvement.  Patient denies any known swelling but states that she is unable to bear weight is using a walking stick for assistance with ambulation.  Patient also reports right hand pain over 3rd and 4th finger x 1 week.  Patient reports that she fell last Sunday and caught herself with her right hand and since that time has had pain and mild swelling to these fingers.  Patient also reports some pain to her right shoulder but denies any specific pain with movement or swelling noted.  Current Medications[1]   Allergies[2]  Past Medical History:  Diagnosis Date   Anemia    Asthma    Bronchitis      Past Surgical History:  Procedure Laterality Date   CARPAL TUNNEL RELEASE     CESAREAN SECTION     COLONOSCOPY N/A 05/14/2024   Procedure: COLONOSCOPY;  Surgeon: Jinny Carmine, MD;  Location: [facility] ENDOSCOPY;  Service: Endoscopy;  Laterality: N/A;   HERNIA REPAIR     KNEE SURGERY      Family History  Problem Relation Age of Onset   Breast cancer Other        m 2nd cousin    Social History[3]  ROS Refer to HPI for ROS details.  Objective:    Vitals: BP 135/82 (BP Location: Left Arm)   Pulse 84   Temp 98.4 F (36.9 C) (Oral)   Resp 18   SpO2 95%   Physical Exam Vitals and nursing note reviewed.  Constitutional:      General: She is not in acute distress.    Appearance: Normal appearance. She is well-developed. She is not ill-appearing or toxic-appearing.  HENT:     Head: Normocephalic and atraumatic.  Cardiovascular:     Rate and Rhythm: Normal rate.  Pulmonary:     Effort: Pulmonary  effort is normal. No respiratory distress.  Musculoskeletal:     Right shoulder: Tenderness present. No swelling, deformity, bony tenderness or crepitus. Normal range of motion. Normal strength. Normal pulse.     Right hand: Swelling, tenderness and bony tenderness present. No deformity. Decreased range of motion. Decreased strength. Normal sensation. Normal capillary refill. Normal pulse.     Left foot: Normal range of motion and normal capillary refill. Tenderness present. No swelling, deformity, bony tenderness or crepitus. Normal pulse.  Skin:    General: Skin is warm and dry.  Neurological:     General: No focal deficit present.     Mental Status: She is alert and oriented to person, place, and time.  Psychiatric:        Mood and Affect: Mood normal.        Behavior: Behavior normal.     Procedures  No results found for this or any previous visit (from the past 24 hours).  Assessment and Plan :     Discharge Instructions       1. Sprain of left foot, initial encounter (Primary) - DG Foot Complete Left x-ray completed in UC shows no acute fracture or dislocation to the left foot - Apply ace wrap to left foot for compression and protection until injury improves. -  Apply CAM boot to left foot for protection and immobilization from further injury.  2. Contusion of right hand, initial encounter - DG Hand Complete Right completed today in UC is negative for fracture or dislocation of the right hand or fingers. - diclofenac  (VOLTAREN ) 50 MG EC tablet; Take 1 tablet (50 mg total) by mouth 2 (two) times daily.  Dispense: 30 tablet; Refill: 1  -Continue to monitor symptoms for any change in severity if there is any escalation of current symptoms or development of new symptoms follow-up in ER for further evaluation and management.      Madgeline Rayo B Marqueze Ramcharan    [1] No current facility-administered medications for this encounter.  Current Outpatient Medications:    albuterol   (VENTOLIN  HFA) 108 (90 Base) MCG/ACT inhaler, Frequency:PHARMDIR   Dosage:90   MCG  Instructions:  Note:used inhaler as needed daily for asthma attacks Dose: 90 MCG, Disp: , Rfl:    beclomethasone (QVAR) 40 MCG/ACT inhaler, Frequency:PHARMDIR   Dosage:40   MCG  Instructions:  Note:use q var inhaler twice daily; if dose is lower than home dose please provide 80 mcg inhaler Dose: 40 MCG, Disp: , Rfl:    celecoxib  (CELEBREX ) 100 MG capsule, Take 1 capsule (100 mg total) by mouth 2 (two) times daily., Disp: 14 capsule, Rfl: 0   cetirizine  (ZYRTEC  ALLERGY) 10 MG tablet, Take 1 tablet (10 mg total) by mouth daily., Disp: 30 tablet, Rfl: 0   diclofenac  (VOLTAREN ) 50 MG EC tablet, Take 1 tablet (50 mg total) by mouth 2 (two) times daily., Disp: 30 tablet, Rfl: 1   fluticasone (FLONASE) 50 MCG/ACT nasal spray, FLUTICASONE PROPIONATE 50 MCG/ACT SUSP, Disp: , Rfl:    lisinopril-hydrochlorothiazide (ZESTORETIC) 20-12.5 MG tablet, Take 1 tablet by mouth daily., Disp: , Rfl:    omeprazole (PRILOSEC) 40 MG capsule, 40 mg., Disp: , Rfl:    SUMAtriptan (IMITREX) 25 MG tablet, 25 mg., Disp: , Rfl:    ADVAIR DISKUS 100-50 MCG/ACT AEPB, Inhale 1 puff into the lungs 2 (two) times daily., Disp: , Rfl:    EPINEPHrine  0.3 mg/0.3 mL IJ SOAJ injection, Inject 0.3 mg into the muscle as needed for anaphylaxis., Disp: , Rfl:    ferrous sulfate  (EQL SLOW RELEASE IRON ) 160 (50 Fe) MG TBCR SR tablet, Take 1 tablet (160 mg total) by mouth daily., Disp: 30 each, Rfl: 6   ferrous sulfate  325 (65 FE) MG tablet, Take 325 mg by mouth daily., Disp: , Rfl:    Fluocinolone Acetonide Body 0.01 % OIL, 0.01 %., Disp: , Rfl:    loperamide  (IMODIUM ) 2 MG capsule, Take 1 capsule (2 mg total) by mouth 4 (four) times daily as needed for diarrhea or loose stools., Disp: 12 capsule, Rfl: 0   methocarbamol  (ROBAXIN ) 500 MG tablet, Take 1 tablet (500 mg total) by mouth 2 (two) times daily., Disp: 10 tablet, Rfl: 0   mupirocin cream (BACTROBAN) 2 %, 2  %., Disp: , Rfl:    nitroGLYCERIN  (NITRODUR - DOSED IN MG/24 HR) 0.2 mg/hr patch, Use 1/4 patch daily to the right elbow., Disp: 30 patch, Rfl: 1 [2]  Allergies Allergen Reactions   Amoxicillin-Pot Clavulanate Hives   Dust Mite Mixed Allergen Ext [Mite (D. Farinae)] Anaphylaxis and Rash   Feraheme  [Ferumoxytol ] Shortness Of Breath    Tolerated feraheme  w/ famotidine  and diphenhydramine  premedication.   Metronidazole Hives   Molds & Smuts Anaphylaxis and Rash   Pineapple Hives   Chocolate Hives  [3]  Social History Tobacco Use  Smoking status: Every Day    Types: Cigars   Smokeless tobacco: Never  Vaping Use   Vaping status: Never Used  Substance Use Topics   Alcohol use: No   Drug use: No     Aurea Goodell B, NP 08/16/24 1633  "
